# Patient Record
Sex: Female | Born: 2000
Health system: Southern US, Community
[De-identification: ages and names within clinical notes are randomized; demographics above are authoritative.]

## PROBLEM LIST (undated history)

## (undated) ENCOUNTER — Inpatient Hospital Stay (HOSPITAL_COMMUNITY): Payer: Self-pay

## (undated) DIAGNOSIS — D649 Anemia, unspecified: Secondary | ICD-10-CM

## (undated) HISTORY — DX: Anemia, unspecified: D64.9

---
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
## 2021-02-03 ENCOUNTER — Ambulatory Visit: Payer: Self-pay

## 2021-03-30 ENCOUNTER — Ambulatory Visit: Payer: Self-pay

## 2021-07-17 ENCOUNTER — Telehealth: Payer: Medicaid Other | Admitting: Nurse Practitioner

## 2021-07-17 DIAGNOSIS — B850 Pediculosis due to Pediculus humanus capitis: Secondary | ICD-10-CM

## 2021-07-17 DIAGNOSIS — J40 Bronchitis, not specified as acute or chronic: Secondary | ICD-10-CM | POA: Diagnosis not present

## 2021-07-17 MED ORDER — PREDNISONE 10 MG PO TABS: 10.0000 mg | ORAL_TABLET | Freq: Every day | ORAL | 0 refills | Status: DC

## 2021-07-17 MED ORDER — PERMETHRIN 5 % EX CREA: TOPICAL_CREAM | CUTANEOUS | 1 refills | Status: DC

## 2021-07-17 MED ORDER — PROMETHAZINE-DM 6.25-15 MG/5ML PO SYRP: 5.0000 mL | ORAL_SOLUTION | Freq: Four times a day (QID) | ORAL | 0 refills | Status: DC | PRN

## 2021-07-17 NOTE — Patient Instructions (Signed)
Deanna Welch, thank you for joining Claiborne Rigg, NP for today's virtual visit.  While this provider is not your primary care provider (PCP), if your PCP is located in our provider database this encounter information will be shared with them immediately following your visit.  Consent: (Patient) Deanna Welch provided verbal consent for this virtual visit at the beginning of the encounter.  Current Medications:  Current Outpatient Medications:    permethrin (ELIMITE) 5 % cream, Prior to application, wash hair with conditioner-free shampoo; rinse with water and towel dry. Apply a sufficient amount of cream rinse to saturate the hair and scalp. Leave on hair for 10 minutes (but no longer), then rinse off with warm water; remove remaining nits with nit comb. A single application is generally sufficient; however, may repeat 7 days after first treatment if lice or nits are still present., Disp: 60 g, Rfl: 1   predniSONE (DELTASONE) 10 MG tablet, Take 1 tablet (10 mg total) by mouth daily with breakfast., Disp: 5 tablet, Rfl: 0   promethazine-dextromethorphan (PROMETHAZINE-DM) 6.25-15 MG/5ML syrup, Take 5 mLs by mouth 4 (four) times daily as needed for cough., Disp: 240 mL, Rfl: 0   cetirizine-pseudoephedrine (ZYRTEC-D) 5-120 MG tablet, Take 1 tablet by mouth daily., Disp: 30 tablet, Rfl: 0   ketoconazole (NIZORAL) 2 % shampoo, Apply 1 application topically 2 (two) times a week., Disp: 120 mL, Rfl: 0   Medications ordered in this encounter:  Meds ordered this encounter  Medications   permethrin (ELIMITE) 5 % cream    Sig: Prior to application, wash hair with conditioner-free shampoo; rinse with water and towel dry. Apply a sufficient amount of cream rinse to saturate the hair and scalp. Leave on hair for 10 minutes (but no longer), then rinse off with warm water; remove remaining nits with nit comb. A single application is generally sufficient; however, may repeat 7 days after first treatment if  lice or nits are still present.    Dispense:  60 g    Refill:  1    Order Specific Question:   Supervising Provider    Answer:   Hyacinth Meeker, BRIAN [[id number]]   promethazine-dextromethorphan (PROMETHAZINE-DM) 6.25-15 MG/5ML syrup    Sig: Take 5 mLs by mouth 4 (four) times daily as needed for cough.    Dispense:  240 mL    Refill:  0    Order Specific Question:   Supervising Provider    Answer:   MILLER, BRIAN [[id number]]   predniSONE (DELTASONE) 10 MG tablet    Sig: Take 1 tablet (10 mg total) by mouth daily with breakfast.    Dispense:  5 tablet    Refill:  0    Order Specific Question:   Supervising Provider    Answer:   Hyacinth Meeker, BRIAN [[id number]]     *If you need refills on other medications prior to your next appointment, please contact your pharmacy*  Follow-Up: Call back or seek an in-person evaluation if the symptoms worsen or if the condition fails to improve as anticipated    If you have been instructed to have an in-person evaluation today at a local Urgent Care facility, please use the link below. It will take you to a list of all of our available [facility], including address, phone number and hours of operation. Please do not delay care.  [facility]  If you or a family member do not have a primary care provider, use the link below to schedule a visit  and establish care. When you choose a [facility] primary care physician or advanced practice provider, you gain a long-term partner in health. Find a Primary Care Provider  Learn more about [facility]'s in-office and virtual care options: [facility]

## 2021-07-17 NOTE — Progress Notes (Signed)
Virtual Visit Consent   Deanna Welch, you are scheduled for a virtual visit with a [facility] provider today.     Just as with appointments in the office, your consent must be obtained to participate.  Your consent will be active for this visit and any virtual visit you may have with one of our providers in the next 365 days.     If you have a MyChart account, a copy of this consent can be sent to you electronically.  All virtual visits are billed to your insurance company just like a traditional visit in the office.    As this is a virtual visit, video technology does not allow for your provider to perform a traditional examination.  This may limit your provider's ability to fully assess your condition.  If your provider identifies any concerns that need to be evaluated in person or the need to arrange testing (such as labs, EKG, etc.), we will make arrangements to do so.     Although advances in technology are sophisticated, we cannot ensure that it will always work on either your end or our end.  If the connection with a video visit is poor, the visit may have to be switched to a telephone visit.  With either a video or telephone visit, we are not always able to ensure that we have a secure connection.     I need to obtain your verbal consent now.   Are you willing to proceed with your visit today?    Deanna Welch has provided verbal consent on 07/17/2021 for a virtual visit (video or telephone).   Claiborne Rigg, NP   Date: 07/17/2021 9:16 AM   Virtual Visit via Video Note   I, Claiborne Rigg, connected with  Deanna Welch  ([id number], 03-25-2001) on 07/17/21 at  9:00 AM EST by a video-enabled telemedicine application and verified that I am speaking with the correct person using two identifiers.  Location: Patient: Virtual Visit Location Patient: Home Provider: Virtual Visit Location Provider: Home Office   I discussed the limitations of evaluation and management by  telemedicine and the availability of in person appointments. The patient expressed understanding and agreed to proceed.    History of Present Illness: Deanna Welch is a 20 y.o. who identifies as a female who was assigned female at birth, and is being seen today for head lice and cough.  HPI:  She endorses dry hacking cough for 2 weeks.  Cough is keeping her up at night as well.  OTC medications include Robitussin with no relief.  She denies any history of asthma, allergies, or smoking.  Associated symptoms of cough: Runny nose.  She also has lice in her har that she cant get rid of. Over the counter medications are not working.   Problems: There are no problems to display for this patient.   Allergies: No Known Allergies Medications:  Current Outpatient Medications:    permethrin (ELIMITE) 5 % cream, Prior to application, wash hair with conditioner-free shampoo; rinse with water and towel dry. Apply a sufficient amount of cream rinse to saturate the hair and scalp. Leave on hair for 10 minutes (but no longer), then rinse off with warm water; remove remaining nits with nit comb. A single application is generally sufficient; however, may repeat 7 days after first treatment if lice or nits are still present., Disp: 60 g, Rfl: 1   predniSONE (DELTASONE) 10 MG tablet, Take 1 tablet (10 mg total) by mouth daily  with breakfast., Disp: 5 tablet, Rfl: 0   promethazine-dextromethorphan (PROMETHAZINE-DM) 6.25-15 MG/5ML syrup, Take 5 mLs by mouth 4 (four) times daily as needed for cough., Disp: 240 mL, Rfl: 0   cetirizine-pseudoephedrine (ZYRTEC-D) 5-120 MG tablet, Take 1 tablet by mouth daily., Disp: 30 tablet, Rfl: 0   ketoconazole (NIZORAL) 2 % shampoo, Apply 1 application topically 2 (two) times a week., Disp: 120 mL, Rfl: 0  Observations/Objective: Patient is well-developed, well-nourished in no acute distress.  Resting comfortably  at home.  Head is normocephalic, atraumatic.  No labored  breathing.  Speech is clear and coherent with logical content.  Patient is alert and oriented at baseline.    Assessment and Plan: 1. Bronchitis - promethazine-dextromethorphan (PROMETHAZINE-DM) 6.25-15 MG/5ML syrup; Take 5 mLs by mouth 4 (four) times daily as needed for cough.  Dispense: 240 mL; Refill: 0 - predniSONE (DELTASONE) 10 MG tablet; Take 1 tablet (10 mg total) by mouth daily with breakfast.  Dispense: 5 tablet; Refill: 0  2. Pediculosis capitis - permethrin (ELIMITE) 5 % cream; Prior to application, wash hair with conditioner-free shampoo; rinse with water and towel dry. Apply a sufficient amount of cream rinse to saturate the hair and scalp. Leave on hair for 10 minutes (but no longer), then rinse off with warm water; remove remaining nits with nit comb. A single application is generally sufficient; however, may repeat 7 days after first treatment if lice or nits are still present.  Dispense: 60 g; Refill: 1   Follow Up Instructions: I discussed the assessment and treatment plan with the patient. The patient was provided an opportunity to ask questions and all were answered. The patient agreed with the plan and demonstrated an understanding of the instructions.  A copy of instructions were sent to the patient via MyChart unless otherwise noted below.     The patient was advised to call back or seek an in-person evaluation if the symptoms worsen or if the condition fails to improve as anticipated.  Time:  I spent 10 minutes with the patient via telehealth technology discussing the above problems/concerns.    Claiborne Rigg, NP

## 2024-01-19 ENCOUNTER — Ambulatory Visit
Admission: EM | Admit: 2024-01-19 | Discharge: 2024-01-19 | Disposition: A | Discharge status: Home / Self Care | Attending: Family Medicine | Admitting: Family Medicine

## 2024-01-19 DIAGNOSIS — R110 Nausea: Secondary | ICD-10-CM | POA: Diagnosis not present

## 2024-01-19 MED ORDER — ONDANSETRON 4 MG PO TBDP: 4.0000 mg | ORAL_TABLET | Freq: Three times a day (TID) | ORAL | 0 refills | Status: AC | PRN

## 2024-01-19 MED ORDER — ONDANSETRON 4 MG PO TBDP
4.0000 mg | ORAL_TABLET | Freq: Once | ORAL | Status: AC
  Administered 2024-01-19: 4 mg via ORAL

## 2024-01-19 NOTE — ED Provider Notes (Signed)
 [facility]    CSN: [id number] Arrival date & time: 01/19/24  0815      History   Chief Complaint No chief complaint on file.   HPI Deanna Welch is a 23 y.o. female.   Patient presenting today with 1 day history of nausea without vomiting.  Denies abdominal pain, fevers, chills, upper respiratory symptoms, diarrhea, rashes, new foods or medications, recent travel, known sick contacts.  So far not trying anything over-the-counter for symptoms.  Tolerating p.o. fluids this morning.  LMP 01/19/2024, currently on menstrual cycle.    History reviewed. No pertinent past medical history.  There are no active problems to display for this patient.   History reviewed. No pertinent surgical history.  OB History   No obstetric history on file.      Home Medications    Prior to Admission medications   Medication Sig Start Date End Date Taking? Authorizing Provider  ondansetron  (ZOFRAN -ODT) 4 MG disintegrating tablet Take 1 tablet (4 mg total) by mouth every 8 (eight) hours as needed for nausea or vomiting. 01/19/24  Yes Corbin Dess, PA-C  cetirizine -pseudoephedrine  (ZYRTEC -D) 5-120 MG tablet Take 1 tablet by mouth daily. 10/09/20   Wellington Half, FNP  ketoconazole  (NIZORAL ) 2 % shampoo Apply 1 application topically 2 (two) times a week. 10/12/20   Wellington Half, FNP  permethrin  (ELIMITE ) 5 % cream Prior to application, wash hair with conditioner-free shampoo; rinse with water and towel dry. Apply a sufficient amount of cream rinse to saturate the hair and scalp. Leave on hair for 10 minutes (but no longer), then rinse off with warm water; remove remaining nits with nit comb. A single application is generally sufficient; however, may repeat 7 days after first treatment if lice or nits are still present. 07/17/21   Fleming, Zelda W, NP  predniSONE  (DELTASONE ) 10 MG tablet Take 1 tablet (10 mg total) by mouth daily with breakfast. 07/17/21   Fleming,  Zelda W, NP  promethazine -dextromethorphan (PROMETHAZINE -DM) 6.25-15 MG/5ML syrup Take 5 mLs by mouth 4 (four) times daily as needed for cough. 07/17/21   Collins Dean, NP    Family History History reviewed. No pertinent family history.  Social History Social History   Tobacco Use   Smoking status: Passive Smoke Exposure - Never Smoker   Smokeless tobacco: Never  Vaping Use   Vaping status: Never Used  Substance Use Topics   Alcohol use: No   Drug use: No     Allergies   Patient has no known allergies.   Review of Systems Review of Systems Per HPI  Physical Exam Triage Vital Signs ED Triage Vitals  Encounter Vitals Group     BP 01/19/24 0853 119/78     Systolic BP Percentile --      Diastolic BP Percentile --      Pulse Rate 01/19/24 0853 (!) 129     Resp 01/19/24 0853 18     Temp 01/19/24 0853 98.4 F (36.9 C)     Temp Source 01/19/24 0853 Oral     SpO2 01/19/24 0853 96 %     Weight --      Height --      Head Circumference --      Peak Flow --      Pain Score 01/19/24 0855 0     Pain Loc --      Pain Education --      Exclude from Growth Chart --    No data  found.  Updated Vital Signs BP 119/78 (BP Location: Right Arm)   Pulse (!) 129   Temp 98.4 F (36.9 C) (Oral)   Resp 18   LMP 01/19/2024 (Exact Date)   SpO2 96%   Visual Acuity Right Eye Distance:   Left Eye Distance:   Bilateral Distance:    Right Eye Near:   Left Eye Near:    Bilateral Near:     Physical Exam Vitals and nursing note reviewed.  Constitutional:      Appearance: Normal appearance. She is not ill-appearing.  HENT:     Head: Atraumatic.  Eyes:     Extraocular Movements: Extraocular movements intact.     Conjunctiva/sclera: Conjunctivae normal.  Cardiovascular:     Rate and Rhythm: Normal rate and regular rhythm.     Heart sounds: Normal heart sounds.  Pulmonary:     Effort: Pulmonary effort is normal.     Breath sounds: Normal breath sounds.  Abdominal:      General: Bowel sounds are normal. There is no distension.     Tenderness: There is no abdominal tenderness. There is no guarding.  Musculoskeletal:        General: Normal range of motion.     Cervical back: Normal range of motion and neck supple.  Skin:    General: Skin is warm and dry.  Neurological:     Mental Status: She is alert and oriented to person, place, and time.  Psychiatric:        Mood and Affect: Mood normal.        Thought Content: Thought content normal.        Judgment: Judgment normal.      UC Treatments / Results  Labs (all labs ordered are listed, but only abnormal results are displayed) Labs Reviewed - No data to display  EKG   Radiology No results found.  Procedures Procedures (including critical care time)  Medications Ordered in UC Medications  ondansetron  (ZOFRAN -ODT) disintegrating tablet 4 mg (4 mg Oral Given 01/19/24 0915)    Initial Impression / Assessment and Plan / UC Course  I have reviewed the triage vital signs and the nursing notes.  Pertinent labs & imaging results that were available during my care of the patient were reviewed by me and considered in my medical decision making (see chart for details).     Zofran  given for active nausea, suspect either related to menstrual cycle or new viral GI illness.  Vitals and exam very reassuring with no red flag findings.  Mildly tachycardic in triage but otherwise vitals within normal limits.  Treat with Zofran , brat diet, fluids, work note given.  Return for worsening symptoms.  Final Clinical Impressions(s) / UC Diagnoses   Final diagnoses:  Nausea without vomiting   Discharge Instructions   None    ED Prescriptions     Medication Sig Dispense Auth. Provider   ondansetron  (ZOFRAN -ODT) 4 MG disintegrating tablet Take 1 tablet (4 mg total) by mouth every 8 (eight) hours as needed for nausea or vomiting. 20 tablet Corbin Dess, [place]      PDMP not reviewed this  encounter.   Tacy Expose [place], [place] 01/19/24 [phone]

## 2024-01-19 NOTE — ED Triage Notes (Signed)
 Pt reports nausea onset this morning, pt states she is currently on her period, coughing up mucus.

## 2024-03-12 ENCOUNTER — Other Ambulatory Visit: Payer: Self-pay

## 2024-03-12 ENCOUNTER — Ambulatory Visit
Admission: EM | Admit: 2024-03-12 | Discharge: 2024-03-12 | Disposition: A | Discharge status: Home / Self Care | Attending: Family Medicine | Admitting: Family Medicine

## 2024-03-12 ENCOUNTER — Encounter: Payer: Self-pay | Admitting: Emergency Medicine

## 2024-03-12 DIAGNOSIS — Z3201 Encounter for pregnancy test, result positive: Secondary | ICD-10-CM | POA: Diagnosis not present

## 2024-03-12 LAB — POCT URINE PREGNANCY: Preg Test, Ur: POSITIVE — AB

## 2024-03-12 NOTE — ED Provider Notes (Signed)
 [facility]    CSN: [id number] Arrival date & time: 03/12/24  1904      History   Chief Complaint Chief Complaint  Patient presents with   Possible Pregnancy    HPI Deanna Welch is a 23 y.o. female.   Patient presenting today requesting pregnancy confirmation.  Had a positive pregnancy test at home yesterday, called [facility] and they requested she come here for pregnancy confirmation.  She states her LMP was 02/02/2024.  Denies pelvic pain, vaginal bleeding or discharge, vomiting but has had some occasional mild nausea.    History reviewed. No pertinent past medical history.  There are no active problems to display for this patient.   History reviewed. No pertinent surgical history.  OB History   No obstetric history on file.      Home Medications    Prior to Admission medications   Medication Sig Start Date End Date Taking? Authorizing Provider  cetirizine -pseudoephedrine  (ZYRTEC -D) 5-120 MG tablet Take 1 tablet by mouth daily. 10/09/20   Alvia Corean CROME, FNP  ketoconazole  (NIZORAL ) 2 % shampoo Apply 1 application topically 2 (two) times a week. 10/12/20   Alvia Corean CROME, FNP  ondansetron  (ZOFRAN -ODT) 4 MG disintegrating tablet Take 1 tablet (4 mg total) by mouth every 8 (eight) hours as needed for nausea or vomiting. 01/19/24   Stuart Vernell Norris, PA-C  permethrin  (ELIMITE ) 5 % cream Prior to application, wash hair with conditioner-free shampoo; rinse with water and towel dry. Apply a sufficient amount of cream rinse to saturate the hair and scalp. Leave on hair for 10 minutes (but no longer), then rinse off with warm water; remove remaining nits with nit comb. A single application is generally sufficient; however, may repeat 7 days after first treatment if lice or nits are still present. 07/17/21   Fleming, Zelda W, NP  predniSONE  (DELTASONE ) 10 MG tablet Take 1 tablet (10 mg total) by mouth daily with breakfast. 07/17/21   Fleming,  Zelda W, NP  promethazine -dextromethorphan (PROMETHAZINE -DM) 6.25-15 MG/5ML syrup Take 5 mLs by mouth 4 (four) times daily as needed for cough. 07/17/21   Theotis Haze ORN, NP    Family History History reviewed. No pertinent family history.  Social History Social History   Tobacco Use   Smoking status: Passive Smoke Exposure - Never Smoker   Smokeless tobacco: Never  Vaping Use   Vaping status: Never Used  Substance Use Topics   Alcohol use: No   Drug use: No     Allergies   Patient has no known allergies.   Review of Systems Review of Systems Per HPI  Physical Exam Triage Vital Signs ED Triage Vitals  Encounter Vitals Group     BP 03/12/24 1908 118/84     Girls Systolic BP Percentile --      Girls Diastolic BP Percentile --      Boys Systolic BP Percentile --      Boys Diastolic BP Percentile --      Pulse Rate 03/12/24 1908 (!) 131     Resp 03/12/24 1908 18     Temp 03/12/24 1908 98.8 F (37.1 C)     Temp Source 03/12/24 1908 Oral     SpO2 03/12/24 1908 98 %     Weight --      Height --      Head Circumference --      Peak Flow --      Pain Score 03/12/24 1910 0  Pain Loc --      Pain Education --      Exclude from Growth Chart --    No data found.  Updated Vital Signs BP 118/84 (BP Location: Right Arm)   Pulse (!) 131   Temp 98.8 F (37.1 C) (Oral)   Resp 18   LMP 02/02/2024 (Approximate)   SpO2 98%   Visual Acuity Right Eye Distance:   Left Eye Distance:   Bilateral Distance:    Right Eye Near:   Left Eye Near:    Bilateral Near:     Physical Exam Vitals and nursing note reviewed.  Constitutional:      Appearance: Normal appearance. She is not ill-appearing.  HENT:     Head: Atraumatic.  Eyes:     Extraocular Movements: Extraocular movements intact.     Conjunctiva/sclera: Conjunctivae normal.  Cardiovascular:     Rate and Rhythm: Normal rate.  Pulmonary:     Effort: Pulmonary effort is normal.  Abdominal:     General:  Bowel sounds are normal. There is no distension.     Palpations: Abdomen is soft.     Tenderness: There is no abdominal tenderness. There is no guarding.  Musculoskeletal:        General: Normal range of motion.     Cervical back: Normal range of motion and neck supple.  Skin:    General: Skin is warm and dry.  Neurological:     Mental Status: She is alert and oriented to person, place, and time.  Psychiatric:        Mood and Affect: Mood normal.        Thought Content: Thought content normal.        Judgment: Judgment normal.      UC Treatments / Results  Labs (all labs ordered are listed, but only abnormal results are displayed) Labs Reviewed  POCT URINE PREGNANCY - Abnormal; Notable for the following components:      Result Value   Preg Test, Ur Positive (*)    All other components within normal limits    EKG   Radiology No results found.  Procedures Procedures (including critical care time)  Medications Ordered in UC Medications - No data to display  Initial Impression / Assessment and Plan / UC Course  I have reviewed the triage vital signs and the nursing notes.  Pertinent labs & imaging results that were available during my care of the patient were reviewed by me and considered in my medical decision making (see chart for details).     Vitals and exam reassuring today, she is tachycardic in triage likely secondary to anxiety.  Well-appearing and in no acute distress.  Pregnancy test positive here, recommend prenatal vitamin, healthy lifestyle and OB/GYN follow-up.  Final Clinical Impressions(s) / UC Diagnoses   Final diagnoses:  Positive pregnancy test     Discharge Instructions      Take your prenatal vitamin daily, [facility]    ED Prescriptions   None    PDMP not reviewed this encounter.   Stuart Vernell Norris, [place] 03/12/24 [phone]

## 2024-03-12 NOTE — ED Triage Notes (Signed)
 Pt reports would like to confirm pregnancy. Reports one home pregnancy test that was positive last night. LMP 6/13, hx of irregular periods.

## 2024-03-12 NOTE — Discharge Instructions (Signed)
 Take your prenatal vitamin daily, [facility] care

## 2024-04-08 ENCOUNTER — Other Ambulatory Visit: Payer: Self-pay | Admitting: Obstetrics & Gynecology

## 2024-04-08 DIAGNOSIS — O3680X0 Pregnancy with inconclusive fetal viability, not applicable or unspecified: Secondary | ICD-10-CM

## 2024-04-09 ENCOUNTER — Other Ambulatory Visit

## 2024-04-09 DIAGNOSIS — Z3491 Encounter for supervision of normal pregnancy, unspecified, first trimester: Secondary | ICD-10-CM | POA: Diagnosis not present

## 2024-04-09 DIAGNOSIS — Z3A08 8 weeks gestation of pregnancy: Secondary | ICD-10-CM | POA: Diagnosis not present

## 2024-04-09 DIAGNOSIS — O3680X0 Pregnancy with inconclusive fetal viability, not applicable or unspecified: Secondary | ICD-10-CM

## 2024-04-09 NOTE — Progress Notes (Signed)
 US  8+1 wks,single IUP with yolk sac,FHR 173 bpm,CRL 15.90 mm,normal right ovary,left ovary not visualized

## 2024-05-03 DIAGNOSIS — Z3400 Encounter for supervision of normal first pregnancy, unspecified trimester: Secondary | ICD-10-CM | POA: Insufficient documentation

## 2024-05-03 DIAGNOSIS — O0990 Supervision of high risk pregnancy, unspecified, unspecified trimester: Secondary | ICD-10-CM | POA: Insufficient documentation

## 2024-05-06 ENCOUNTER — Other Ambulatory Visit (HOSPITAL_COMMUNITY)
Admission: RE | Admit: 2024-05-06 | Discharge: 2024-05-06 | Disposition: A | Discharge status: Home / Self Care | Source: Ambulatory Visit | Attending: Advanced Practice Midwife | Admitting: Advanced Practice Midwife

## 2024-05-06 ENCOUNTER — Encounter: Payer: Self-pay | Admitting: Advanced Practice Midwife

## 2024-05-06 ENCOUNTER — Ambulatory Visit (INDEPENDENT_AMBULATORY_CARE_PROVIDER_SITE_OTHER): Admitting: Advanced Practice Midwife

## 2024-05-06 ENCOUNTER — Encounter: Admitting: *Deleted

## 2024-05-06 VITALS — BP 113/77 | HR 114 | Wt 121.0 lb

## 2024-05-06 DIAGNOSIS — Z124 Encounter for screening for malignant neoplasm of cervix: Secondary | ICD-10-CM | POA: Diagnosis not present

## 2024-05-06 DIAGNOSIS — Z3400 Encounter for supervision of normal first pregnancy, unspecified trimester: Secondary | ICD-10-CM | POA: Insufficient documentation

## 2024-05-06 DIAGNOSIS — Z31430 Encounter of female for testing for genetic disease carrier status for procreative management: Secondary | ICD-10-CM

## 2024-05-06 DIAGNOSIS — Z363 Encounter for antenatal screening for malformations: Secondary | ICD-10-CM | POA: Diagnosis not present

## 2024-05-06 DIAGNOSIS — Z3401 Encounter for supervision of normal first pregnancy, first trimester: Secondary | ICD-10-CM

## 2024-05-06 DIAGNOSIS — Z3A12 12 weeks gestation of pregnancy: Secondary | ICD-10-CM | POA: Insufficient documentation

## 2024-05-06 DIAGNOSIS — Z3481 Encounter for supervision of other normal pregnancy, first trimester: Secondary | ICD-10-CM

## 2024-05-06 MED ORDER — BLOOD PRESSURE CUFF MISC: 1.0000 | 0 refills | Status: AC

## 2024-05-06 MED ORDER — BLOOD PRESSURE CUFF MISC: 1.0000 | 0 refills | Status: DC

## 2024-05-06 MED ORDER — ASPIRIN 81 MG PO TBEC: 81.0000 mg | DELAYED_RELEASE_TABLET | Freq: Every day | ORAL | 6 refills | Status: AC

## 2024-05-06 NOTE — Progress Notes (Signed)
 INITIAL OBSTETRICAL VISIT Patient name: Deanna Welch MRN [id number]  Date of birth: Mar 27, 2001 Chief Complaint:   Initial Prenatal Visit  History of Present Illness:   Deanna Welch is a 23 y.o. G1P0 Caucasian female at [redacted]w[redacted]d by sue LMP with an Estimated Date of Delivery: 11/18/24 being seen today for her initial obstetrical visit.   Patient's last menstrual period was 02/12/2024 (exact date). Her obstetrical history is significant for primagravida       05/06/2024   11:47 AM 09/09/2019    1:41 PM  Depression screen PHQ 2/9  Decreased Interest 3 2  Down, Depressed, Hopeless 1 2  PHQ - 2 Score 4 4  Altered sleeping 3 2  Tired, decreased energy 3 0  Change in appetite 3 0  Feeling bad or failure about yourself  0 2  Trouble concentrating 0 2  Moving slowly or fidgety/restless 0 0  Suicidal thoughts 0   PHQ-9 Score 13 10        05/06/2024   11:47 AM  GAD 7 : Generalized Anxiety Score  Nervous, Anxious, on Edge 1  Control/stop worrying 1  Worry too much - different things 1  Trouble relaxing 2  Restless 1  Easily annoyed or irritable 3  Afraid - awful might happen 1  Total GAD 7 Score 10    Mental Health score elevated Follow up  [place] info given  Review of Systems:   Pertinent items are noted in HPI Denies cramping/contractions, leakage of fluid, vaginal bleeding, abnormal vaginal discharge w/ itching/odor/irritation, headaches, visual changes, shortness of breath, chest pain, abdominal pain, severe nausea/vomiting, or problems with urination or bowel movements unless otherwise stated above.  Pertinent History Reviewed:  Reviewed past medical,surgical, social, obstetrical and family history.  Reviewed problem list, medications and allergies. OB History  Gravida Para Term Preterm AB Living  1       SAB IAB Ectopic Multiple Live Births          # Outcome Date GA Lbr Len/2nd Weight Sex Type Anes PTL Lv  1 Current            Physical Assessment:   Vitals:    05/06/24 1139  BP: 113/77  Pulse: (!) 114  Weight: 121 lb (54.9 kg)  There is no height or weight on file to calculate BMI.       Physical Examination:  General appearance - well appearing, and in no distress  Mental status - alert, oriented to person, place, and time  Psych:  She has a normal mood and affect  Skin - warm and dry, normal color, no suspicious lesions noted  Chest - effort normal, all lung fields clear to auscultation bilaterally  Heart - normal rate and regular rhythm  Abdomen - soft, nontender  Extremities:  No swelling or varicosities noted  Pelvic - VULVA: normal appearing vulva with no masses, tenderness or lesions  VAGINA: normal appearing vagina with normal color and discharge, no lesions  CERVIX: normal appearing cervix without discharge or lesions, no CMT  Thin prep pap is done without HR HPV cotesting  Chaperone: Peggy Dones  TODAY'S FHR 160 US   No results found for this or any previous visit (from the past 24 hours).  Assessment & Plan:  1) low-Risk Pregnancy G1P0 at [redacted]w[redacted]d with an Estimated Date of Delivery: 11/18/24   2) Initial OB visit  3)   Meds:  Meds ordered this encounter  Medications   DISCONTD: Blood Pressure Monitoring (BLOOD PRESSURE CUFF)  MISC    Sig: 1 kit by Does not apply route as directed.    Dispense:  1 each    Refill:  0   aspirin  EC 81 MG tablet    Sig: Take 1 tablet (81 mg total) by mouth daily.    Dispense:  30 tablet    Refill:  6    Supervising Provider:   JAYNE MINDER H [[id number]]   Blood Pressure Monitoring (BLOOD PRESSURE CUFF) MISC    Sig: 1 kit by Does not apply route as directed.    Dispense:  1 each    Refill:  0    Initial labs obtained Continue prenatal vitamins Reviewed n/v relief measures and warning s/s to report Reviewed recommended weight gain based on pre-gravid BMI Encouraged well-balanced diet Genetic & carrier screening discussed: declines Panorama, NT/IT, AFP, and Horizon ,  Ultrasound discussed;  fetal survey: requested CCNC completed> form faxed if has or is planning to apply for medicaid The nature of [employer] for [employer] with multiple MDs and other Advanced Practice Providers was explained to patient; also emphasized that fellows, residents, and students are part of our team. Does not have home bp cuff. Office bp cuff given: yes. Rx sent: n/a. Check bp weekly, let us  know if consistently >140/90.   Indications for ASA therapy (per uptodate)   OR Two or more of the following: Nulliparity Yes Obesity (BMI>30 kg/m2) No Family h/o preeclampsia in mother or sister Yes  Follow-up: Return in about 4 weeks (around 06/03/2024) for LROB and 7 weeks anatomy scan/LROB.   Orders Placed This Encounter  Procedures   Urine Culture   US  OB Comp + 14 Wk   CBC/D/Plt+RPR+Rh+ABO+RubIgG...    Cathlean Ely CNM, [facility] 05/06/2024 4:21 PM

## 2024-05-06 NOTE — Patient Instructions (Addendum)
 [facility] offers online women's holistic mental health counseling and therapy provided by licensed mental health counselors and therapists.   You can refer yourself using the link below: (if it isn't clickable from your mychart account, copy and paste it in a new browser).  If you have ANY problems, please let me know and I will help troubleshoot.   [email or web address]  OR  [email or web address]      Gaylen Ford, I greatly value your feedback.  If you receive a survey following your visit with us  today, we appreciate you taking the time to fill it out.  Thanks, Sherrell Ely, DNP, CNM  [facility] HAS MOVED!!! It is now [facility] at [facility] ([address] [place], [place] [address]) Entrance located off of [address] Free 24/7 valet parking   Nausea & Vomiting Have saltine crackers or pretzels by your bed and eat a few bites before you raise your head out of bed in the morning Eat small frequent meals throughout the day instead of large meals Drink plenty of fluids throughout the day to stay hydrated, just don't drink a lot of fluids with your meals.  This can make your stomach fill up faster making you feel sick Do not brush your teeth right after you eat Products with real ginger are good for nausea, like ginger ale and ginger hard candy Make sure it says made with real ginger! Sucking on sour candy like lemon heads is also good for nausea If your prenatal vitamins make you nauseated, take them at night so you will sleep through the nausea Sea Bands If you feel like you need medicine for the nausea & vomiting please let us  know If you are unable to keep any fluids or food down please let us  know   Constipation Drink plenty of fluid, preferably water, throughout the day Eat foods high in fiber such as fruits, vegetables, and grains Exercise, such as walking, is a good  way to keep your bowels regular Drink warm fluids, especially warm prune juice, or decaf coffee Eat a 1/2 cup of real oatmeal (not instant), 1/2 cup applesauce, and 1/2-1 cup warm prune juice every day If needed, you may take Colace (docusate sodium) stool softener once or twice a day to help keep the stool soft.  If you still are having problems with constipation, you may take Miralax once daily as needed to help keep your bowels regular.   Home Blood Pressure Monitoring for Patients   Your provider has recommended that you check your blood pressure (BP) at least once a week at home. If you do not have a blood pressure cuff at home, one will be provided for you. Contact your provider if you have not received your monitor within 1 week.   Helpful Tips for Accurate Home Blood Pressure Checks  Don't smoke, exercise, or drink caffeine 30 minutes before checking your BP Use the restroom before checking your BP (a full bladder can raise your pressure) Relax in a comfortable upright chair Feet on the ground Left arm resting comfortably on a flat surface at the level of your heart Legs uncrossed Back supported Sit quietly and don't talk Place the cuff on your bare arm Adjust snuggly, so that only two fingertips can fit between your skin and the top of the cuff Check 2 readings separated by at least one minute Keep a log of your BP readings For a visual, please reference this diagram: http://ccnc.care/bpdiagram  Provider Name: [facility]  OB/GYN     Phone: [phone]  Zone 1: ALL CLEAR  Continue to monitor your symptoms:  BP reading is less than 140 (top number) or less than 90 (bottom number)  No right upper stomach pain No headaches or seeing spots No feeling nauseated or throwing up No swelling in face and hands  Zone 2: CAUTION Call your doctor's office for any of the following:  BP reading is greater than 140 (top number) or greater than 90 (bottom number)  Stomach pain under  your ribs in the middle or right side Headaches or seeing spots Feeling nauseated or throwing up Swelling in face and hands  Zone 3: EMERGENCY  Seek immediate medical care if you have any of the following:  BP reading is greater than160 (top number) or greater than 110 (bottom number) Severe headaches not improving with Tylenol  Serious difficulty catching your breath Any worsening symptoms from Zone 2    First Trimester of Pregnancy The first trimester of pregnancy is from week 1 until the end of week 12 (months 1 through 3). A week after a sperm fertilizes an egg, the egg will implant on the wall of the uterus. This embryo will begin to develop into a baby. Genes from you and your partner are forming the baby. The female genes determine whether the baby is a boy or a girl. At 6-8 weeks, the eyes and face are formed, and the heartbeat can be seen on ultrasound. At the end of 12 weeks, all the baby's organs are formed.  Now that you are pregnant, you will want to do everything you can to have a healthy baby. Two of the most important things are to get good prenatal care and to follow your health care provider's instructions. Prenatal care is all the medical care you receive before the baby's birth. This care will help prevent, find, and treat any problems during the pregnancy and childbirth. BODY CHANGES Your body goes through many changes during pregnancy. The changes vary from woman to woman.  You may gain or lose a couple of pounds at first. You may feel sick to your stomach (nauseous) and throw up (vomit). If the vomiting is uncontrollable, call your health care provider. You may tire easily. You may develop headaches that can be relieved by medicines approved by your health care provider. You may urinate more often. Painful urination may mean you have a bladder infection. You may develop heartburn as a result of your pregnancy. You may develop constipation because certain hormones are  causing the muscles that push waste through your intestines to slow down. You may develop hemorrhoids or swollen, bulging veins (varicose veins). Your breasts may begin to grow larger and become tender. Your nipples may stick out more, and the tissue that surrounds them (areola) may become darker. Your gums may bleed and may be sensitive to brushing and flossing. Dark spots or blotches (chloasma, mask of pregnancy) may develop on your face. This will likely fade after the baby is born. Your menstrual periods will stop. You may have a loss of appetite. You may develop cravings for certain kinds of food. You may have changes in your emotions from day to day, such as being excited to be pregnant or being concerned that something may go wrong with the pregnancy and baby. You may have more vivid and strange dreams. You may have changes in your hair. These can include thickening of your hair, rapid growth, and changes in texture. Some women also have  hair loss during or after pregnancy, or hair that feels dry or thin. Your hair will most likely return to normal after your baby is born. WHAT TO EXPECT AT YOUR PRENATAL VISITS During a routine prenatal visit: You will be weighed to make sure you and the baby are growing normally. Your blood pressure will be taken. Your abdomen will be measured to track your baby's growth. The fetal heartbeat will be listened to starting around week 10 or 12 of your pregnancy. Test results from any previous visits will be discussed. Your health care provider may ask you: How you are feeling. If you are feeling the baby move. If you have had any abnormal symptoms, such as leaking fluid, bleeding, severe headaches, or abdominal cramping. If you have any questions. Other tests that may be performed during your first trimester include: Blood tests to find your blood type and to check for the presence of any previous infections. They will also be used to check for low iron  levels (anemia) and Rh antibodies. Later in the pregnancy, blood tests for diabetes will be done along with other tests if problems develop. Urine tests to check for infections, diabetes, or protein in the urine. An ultrasound to confirm the proper growth and development of the baby. An amniocentesis to check for possible genetic problems. Fetal screens for spina bifida and Down syndrome. You may need other tests to make sure you and the baby are doing well. HOME CARE INSTRUCTIONS  Medicines Follow your health care provider's instructions regarding medicine use. Specific medicines may be either safe or unsafe to take during pregnancy. Take your prenatal vitamins as directed. If you develop constipation, try taking a stool softener if your health care provider approves. Diet Eat regular, well-balanced meals. Choose a variety of foods, such as meat or vegetable-based protein, fish, milk and low-fat dairy products, vegetables, fruits, and whole grain breads and cereals. Your health care provider will help you determine the amount of weight gain that is right for you. Avoid raw meat and uncooked cheese. These carry germs that can cause birth defects in the baby. Eating four or five small meals rather than three large meals a day may help relieve nausea and vomiting. If you start to feel nauseous, eating a few soda crackers can be helpful. Drinking liquids between meals instead of during meals also seems to help nausea and vomiting. If you develop constipation, eat more high-fiber foods, such as fresh vegetables or fruit and whole grains. Drink enough fluids to keep your urine clear or pale yellow. Activity and Exercise Exercise only as directed by your health care provider. Exercising will help you: Control your weight. Stay in shape. Be prepared for labor and delivery. Experiencing pain or cramping in the lower abdomen or low back is a good sign that you should stop exercising. Check with your  health care provider before continuing normal exercises. Try to avoid standing for long periods of time. Move your legs often if you must stand in one place for a long time. Avoid heavy lifting. Wear low-heeled shoes, and practice good posture. You may continue to have sex unless your health care provider directs you otherwise. Relief of Pain or Discomfort Wear a good support bra for breast tenderness.   Take warm sitz baths to soothe any pain or discomfort caused by hemorrhoids. Use hemorrhoid cream if your health care provider approves.   Rest with your legs elevated if you have leg cramps or low back pain. If you  develop varicose veins in your legs, wear support hose. Elevate your feet for 15 minutes, 3-4 times a day. Limit salt in your diet. Prenatal Care Schedule your prenatal visits by the twelfth week of pregnancy. They are usually scheduled monthly at first, then more often in the last 2 months before delivery. Write down your questions. Take them to your prenatal visits. Keep all your prenatal visits as directed by your health care provider. Safety Wear your seat belt at all times when driving. Make a list of emergency phone numbers, including numbers for family, friends, the hospital, and police and fire departments. General Tips Ask your health care provider for a referral to a local prenatal education class. Begin classes no later than at the beginning of month 6 of your pregnancy. Ask for help if you have counseling or nutritional needs during pregnancy. Your health care provider can offer advice or refer you to specialists for help with various needs. Do not use hot tubs, steam rooms, or saunas. Do not douche or use tampons or scented sanitary pads. Do not cross your legs for long periods of time. Avoid cat litter boxes and soil used by cats. These carry germs that can cause birth defects in the baby and possibly loss of the fetus by miscarriage or stillbirth. Avoid all smoking,  herbs, alcohol, and medicines not prescribed by your health care provider. Chemicals in these affect the formation and growth of the baby. Schedule a dentist appointment. At home, brush your teeth with a soft toothbrush and be gentle when you floss. SEEK MEDICAL CARE IF:  You have dizziness. You have mild pelvic cramps, pelvic pressure, or nagging pain in the abdominal area. You have persistent nausea, vomiting, or diarrhea. You have a bad smelling vaginal discharge. You have pain with urination. You notice increased swelling in your face, hands, legs, or ankles. SEEK IMMEDIATE MEDICAL CARE IF:  You have a fever. You are leaking fluid from your vagina. You have spotting or bleeding from your vagina. You have severe abdominal cramping or pain. You have rapid weight gain or loss. You vomit blood or material that looks like coffee grounds. You are exposed to [place] measles and have never had them. You are exposed to fifth disease or chickenpox. You develop a severe headache. You have shortness of breath. You have any kind of trauma, such as from a fall or a car accident. Document Released: 08/02/2001 Document Revised: 12/23/2013 Document Reviewed: 06/18/2013 [facility] Patient Information 2015 [place], [place]. This information is not intended to replace advice given to you by your health care provider. Make sure you discuss any questions you have with your health care provider.  ADDITIONAL HEALTHCARE OPTIONS FOR PATIENTS  [facility] / e-Visit: [email or web address]         [facility]: [phone]  [facility]: [phone]                   [facility]: [phone]     Safe Medications in Pregnancy   Acne: Benzoyl Peroxide Salicylic Acid  Backache/Headache: Tylenol : 2 regular strength every 4 hours OR              2 Extra strength every 6 hours  Colds/Coughs/Allergies: Benadryl  (alcohol free) 25 mg every 6 hours as needed Breath right strips Claritin Cepacol throat lozenges Chloraseptic throat spray Cold-Eeze- up to three times per day Cough drops, alcohol free Flonase (by prescription only) Guaifenesin Mucinex Robitussin DM (plain only, alcohol  free) Saline nasal spray/drops Sudafed (pseudoephedrine ) & Actifed ** use only after [redacted] weeks gestation and if you do not have high blood pressure Tylenol  Vicks Vaporub Zinc lozenges Zyrtec    Constipation: Colace Ducolax suppositories Fleet enema Glycerin suppositories Metamucil Milk of magnesia Miralax Senokot Smooth move tea  Diarrhea: Kaopectate Imodium A-D  *NO pepto Bismol  Hemorrhoids: Anusol Anusol HC Preparation H Tucks  Indigestion: Tums Maalox Mylanta Zantac  Pepcid  Insomnia: Benadryl (alcohol free) 25mg  every 6 hours as needed Tylenol  PM Unisom, no Gelcaps  Leg Cramps: Tums MagGel  Nausea/Vomiting:  Bonine Dramamine Emetrol Ginger extract Sea bands Meclizine  Nausea medication to take during pregnancy:  Unisom (doxylamine succinate 25 mg tablets) Take one tablet daily at bedtime. If symptoms are not adequately controlled, the dose can be increased to a maximum recommended dose of two tablets daily (1/2 tablet in the morning, 1/2 tablet mid-afternoon and one at bedtime). Vitamin B6 100mg  tablets. Take one tablet twice a day (up to 200 mg per day).  Skin Rashes: Aveeno products Benadryl cream or 25mg  every 6 hours as needed Calamine Lotion 1% cortisone cream  Yeast infection: Gyne-lotrimin 7 Monistat 7   **If taking multiple medications, please check labels to avoid duplicating the same active ingredients **take medication as directed on the label ** Do not exceed 4000 mg of tylenol  in 24 hours **Do not take medications that contain aspirin  or ibuprofen    ([phone] is the phone number for Pregnancy Classes or hospital tours at [facility].    You will be referred to  [email or web address]   for more information on childbirth classes   At this site you may register for classes. You may sign up for a waiting list if classes are full. Please SIGN UP FOR THIS!.   When the waiting list becomes long, sometimes new classes can be added.  [facility] at [facility] Call to Register: [phone] or [phone]   or   Register Online: [email or web address] THESE CLASSES FILL UP VERY QUICKLY, SO SIGN UP AS SOON AS YOU CAN!!! Please visit [facility] pregnancy website at [email or web address]  Childbirth Classes  Option 1: Birth & Baby Series Series of 3 weekly classes, on the same day of the week (can choose Mon-Thurs) from 6-9pm Helps you and your support person prepare for childbirth Reviews newborn care, labor & birth, cesarean birth, pain management, and comfort techniques Cost: $60 per couple for insured or self-pay, $30 per couple for Medicaid  Option 2: Weekend Birth & Baby This class is a weekend version of our Birth & Baby series.  It is designed for parents who have a difficult time fitting several weeks of classes into their schedule.   Covers the care of your newborn and the basics of labor and childbirth Friday 6:30pm-8:30pm Saturday 9am-4pm, includes lunch for you and your partner  Cost: $75 per couple for insured or self-pay, $30 per couple for Medicaid  Option 3: Natural Childbirth This series of 5 weekly classes is for expectant parents who want to learn and practice natural methods of coping with the process of labor and childbirth.  Can choose Mon or Tues, 7-9pm.   Covers relaxation, breathing, massage, visualization, role of the partner, and helpful positioning Participants learn how to be confident in their body's ability to give birth. Class empowers and helps parents make informed decisions about care. Includes  discussion that will help new parents transition into the immediate postpartum period.  Cost: $75 per couple for insured or  self-pay, $30 per couple for Medicaid  Option 4: Online Birth & Baby This online class offers you the freedom to complete a Birth & Baby series in the comfort of your own home.  The flexibility of this option allows you to review sections at your own pace, at times convenient to you and your support people.  It includes additional video information, animations, quizzes and extended activities. Get organized with helpful eClass tools, checklists, and trackers.  Cost: $60 for 60 days of online access                                                                            Other Available Classes  Baby & Me Enjoy this time to discuss newborn & infant parenting topics and family adjustment issues with other new mothers in a relaxed environment. Each week brings a new speaker or baby-centered activity. We encourage mothers and their babies (birth to crawling) to join us . You are welcome to visit this group even if you haven't delivered yet! It's wonderful to make new friends early and watch other moms interact with their babies. No registration or fee.  Big Brother/Big Sister Let your children share in the joy of a new brother or sister in this special class designed just for them. Discussion includes how families care for babies: swaddling, holding, diapering, safety, as well as how they can be helpful in their new role. This class is designed for children ages 2 to 19, but any age is welcome. Please register each child individually. $5 Breastfeeding Support Group This group is a mother-to-mother support circle where moms have the opportunity to share their breastfeeding experiences. A Breastfeeding Support nurse is present for questions and concerns. An infant scale is available for weight checks. No fee or registration.  Breastfeeding Your Baby Breastfeeding is a special time  for mother and child. This class will help you feel ready to begin this important relationship. Your partner is encouraged to attend with you. Learn what to expect and feel more confident in the first days of breastfeeding your newborn. This class also addresses the most common fears and challenges of breastfeeding during the first few weeks, months, and beyond. $30 per couple Caring for Baby This class is for expectant and adoptive parents who want to learn and practice the most up-to-date newborn care for their babies. Focus is on birth through first six weeks of life. Topics include feeding, bathing, diapering, crying, umbilical cord care, circumcision care and safe sleep. Parents learn how to recognize symptoms of illness and when to call the pediatrician. Register only the mom-to-be and your partner can plan to come with you. (*Note: This class is included in the Birth & Baby series and the Weekend Birth & Baby classes.) $10 per couple Comfort Techniques & Tour This 2-hour interactive class is designed for those who either do not wish to take the Birth & Baby series or for those who prefer our online childbirth class, but don't want to miss the opportunity to learn and practice hands-on techniques. These skills can help relieve some of the discomfort of labor and encourage your baby to rotate toward the best position for birth. You and your partner will be  able to try a variety of labor positions with birth balls and rebozos as well as practice breathing, relaxation, and visual techniques. $20 per couple [employer] [facility] This course offers Dads-to-be the tools and knowledge needed to feel confident on their journey to becoming new fathers. Experienced dads, who have been trained as coaches, teach dads-to-be how to hold, comfort, diapers, swaddle and play with their infant while being able to support the new mom as well. $25 Grandparent Love Expecting a grandbaby? Learn about the latest infant care and  safety recommendations and ways to support your own child as he or she transitions into the parenting role. $10 per person Infant and Child CPR Parents, grandparents, babysitters, and friends learn Cardio-Pulmonary Resuscitation skills for infants and children. You will also learn how to treat both conscious and unconscious choking infants and children. Register each participant individually. (Note: This Family & Friends program does not offer certification.) $20 per person Marvelous Multiples Expecting twins, triplets, or more? This free 2-hour class covers the differences in labor, birth, parenting, and breastfeeding issues that face multiples' parents.  Maternity Care Center Virtual Tour  Online virtual tour of the new [facility] Talk This free mom-led group offers support and connection to mothers as they journey through the adjustments and struggles of that sometimes overwhelming first year after the birth of a child. A member of our staff will be present to share resources and additional support if needed, as you care for yourself and baby. You are welcome to visit this group before you deliver! It's wonderful to meet new friends early and watch other moms interact with their babies.  Waterbirth Class Interested in a waterbirth? This free informational class will help you discover whether waterbirth is the right fit for you and is required if you are planning a waterbirth. Education about waterbirth itself, supplies you may need, and what you may need from your support team is included in this class. Partners are encouraged to come.

## 2024-05-07 LAB — AB SCR+ANTIBODY ID

## 2024-05-07 LAB — CYTOLOGY - PAP
Chlamydia: NEGATIVE
Comment: NEGATIVE
Comment: NORMAL
Diagnosis: NEGATIVE
Neisseria Gonorrhea: NEGATIVE

## 2024-05-08 LAB — URINE CULTURE

## 2024-05-09 ENCOUNTER — Encounter: Payer: Self-pay | Admitting: Advanced Practice Midwife

## 2024-05-09 DIAGNOSIS — Z6791 Unspecified blood type, Rh negative: Secondary | ICD-10-CM | POA: Insufficient documentation

## 2024-05-10 ENCOUNTER — Encounter: Payer: Self-pay | Admitting: Advanced Practice Midwife

## 2024-05-10 DIAGNOSIS — O360990 Maternal care for other rhesus isoimmunization, unspecified trimester, not applicable or unspecified: Secondary | ICD-10-CM | POA: Insufficient documentation

## 2024-05-10 DIAGNOSIS — O360910 Maternal care for other rhesus isoimmunization, first trimester, not applicable or unspecified: Secondary | ICD-10-CM | POA: Insufficient documentation

## 2024-05-10 LAB — CBC/D/PLT+RPR+RH+ABO+RUBIGG...
Basophils Absolute: 0 x10E3/uL (ref 0.0–0.2)
Basos: 0 %
EOS (ABSOLUTE): 0 x10E3/uL (ref 0.0–0.4)
Eos: 1 %
HCV Ab: NONREACTIVE
HIV Screen 4th Generation wRfx: NONREACTIVE
Hematocrit: 40.5 % (ref 34.0–46.6)
Hemoglobin: 13.3 g/dL (ref 11.1–15.9)
Hepatitis B Surface Ag: NEGATIVE
Immature Grans (Abs): 0 x10E3/uL (ref 0.0–0.1)
Immature Granulocytes: 0 %
Lymphocytes Absolute: 0.9 x10E3/uL (ref 0.7–3.1)
Lymphs: 14 %
MCH: 30 pg (ref 26.6–33.0)
MCHC: 32.8 g/dL (ref 31.5–35.7)
MCV: 91 fL (ref 79–97)
Monocytes Absolute: 0.4 x10E3/uL (ref 0.1–0.9)
Monocytes: 6 %
Neutrophils Absolute: 5.2 x10E3/uL (ref 1.4–7.0)
Neutrophils: 79 %
Platelets: 177 x10E3/uL (ref 150–450)
RBC: 4.44 x10E6/uL (ref 3.77–5.28)
RDW: 11.8 % (ref 11.7–15.4)
RPR Ser Ql: NONREACTIVE
Rh Factor: NEGATIVE
Rubella Antibodies, IGG: 1.29 {index} (ref >0.99)
WBC: 6.7 x10E3/uL (ref 3.4–10.8)

## 2024-05-10 LAB — AB SCR+ANTIBODY ID
Antibody Screen: POSITIVE — AB
Coombs Titer #1: 4

## 2024-05-10 LAB — HCV INTERPRETATION

## 2024-05-16 ENCOUNTER — Ambulatory Visit: Payer: Self-pay | Admitting: Advanced Practice Midwife

## 2024-06-03 ENCOUNTER — Other Ambulatory Visit (HOSPITAL_COMMUNITY)
Admission: RE | Admit: 2024-06-03 | Discharge: 2024-06-03 | Disposition: A | Discharge status: Home / Self Care | Source: Ambulatory Visit | Attending: Women's Health | Admitting: Women's Health

## 2024-06-03 ENCOUNTER — Encounter: Payer: Self-pay | Admitting: Women's Health

## 2024-06-03 ENCOUNTER — Ambulatory Visit: Admitting: Women's Health

## 2024-06-03 VITALS — BP 118/71 | HR 121 | Wt 121.0 lb

## 2024-06-03 DIAGNOSIS — O26852 Spotting complicating pregnancy, second trimester: Secondary | ICD-10-CM | POA: Insufficient documentation

## 2024-06-03 DIAGNOSIS — Z3402 Encounter for supervision of normal first pregnancy, second trimester: Secondary | ICD-10-CM | POA: Insufficient documentation

## 2024-06-03 DIAGNOSIS — Z3A16 16 weeks gestation of pregnancy: Secondary | ICD-10-CM

## 2024-06-03 NOTE — Patient Instructions (Signed)
 Gaylen, thank you for choosing our office today! We appreciate the opportunity to meet your healthcare needs. You may receive a short survey by mail, e-mail, or through [employer]. If you are happy with your care we would appreciate if you could take just a few minutes to complete the survey questions. We read all of your comments and take your feedback very seriously. Thank you again for choosing our office.  Center for [employer] Team at [facility] at [facility] ([address] [place], [place] [address]) Entrance C, located off of [address] Free 24/7 valet parking  Go to [email or web address] to register for FREE online childbirth classes  Call the office [phone]) or go to [facility] if: You begin to severe cramping Your water breaks.  Sometimes it is a big gush of fluid, sometimes it is just a trickle that keeps getting your panties wet or running down your legs You have vaginal bleeding.  It is normal to have a small amount of spotting if your cervix was checked.   [facility] Pediatricians/Family Doctors [facility]): [address] [place], [phone]           [facility]: [address], [phone]                [facility]): [address], [phone] (call to ask if accepting patients) [facility] Department: [address], [place], [phone]    [facility] Pediatricians/Family Doctors [facility]): [phone] [address], [phone] [facility]: [address] [place], [phone] [facility]: [address], [phone]  [facility] Doctors  [facility]): [phone] [facility]: [address], [phone]   [facility] Doctors [facility]: [address], [phone]  [facility] Doctors  [facility]: [phone], [phone]  Home Blood Pressure Monitoring for Patients   Your provider has recommended that you check your blood pressure (BP) at least once a week at home. If you do not have a blood pressure cuff at home, one will be provided for you. Contact your provider if you have not received your monitor within 1 week.   Helpful Tips for Accurate Home Blood Pressure Checks  Don't smoke, exercise, or drink caffeine 30 minutes before checking your BP Use the restroom before checking your BP (a full bladder can raise your pressure) Relax in a comfortable upright chair Feet on the ground Left arm resting comfortably on a flat surface at the level of your heart Legs uncrossed Back supported Sit quietly and don't talk Place the cuff on your bare arm Adjust snuggly, so that only two fingertips can fit between your skin and the top of the cuff Check 2 readings separated by at least one minute Keep a log of your BP readings For a visual, please reference this diagram: http://ccnc.care/bpdiagram  Provider Name: [facility]     Phone: [phone]  Zone 1: ALL CLEAR  Continue to monitor your symptoms:  BP reading is less than 140 (top number) or less than 90 (bottom number)  No right upper stomach pain No headaches or seeing spots No feeling nauseated or throwing up No swelling in face and hands  Zone 2: CAUTION Call your doctor's office for any of the following:  BP reading is greater than 140 (top number) or greater than  90 (bottom number)  Stomach pain under your ribs in the middle or right side Headaches or seeing spots Feeling nauseated or throwing up Swelling in face and hands  Zone 3: EMERGENCY  Seek immediate medical care if you have any of the following:  BP reading is greater than160 (top number) or greater than 110 (bottom number) Severe headaches not improving with Tylenol  Serious difficulty catching your breath Any worsening symptoms from  Zone 2     Second Trimester of Pregnancy The second trimester is from week 14 through week 27 (months 4 through 6). The second trimester is often a time when you feel your best. Your body has adjusted to being pregnant, and you begin to feel better physically. Usually, morning sickness has lessened or quit completely, you may have more energy, and you may have an increase in appetite. The second trimester is also a time when the fetus is growing rapidly. At the end of the sixth month, the fetus is about 9 inches long and weighs about 1 pounds. You will likely begin to feel the baby move (quickening) between 16 and 20 weeks of pregnancy. Body changes during your second trimester Your body continues to go through many changes during your second trimester. The changes vary from woman to woman. Your weight will continue to increase. You will notice your lower abdomen bulging out. You may begin to get stretch marks on your hips, abdomen, and breasts. You may develop headaches that can be relieved by medicines. The medicines should be approved by your health care provider. You may urinate more often because the fetus is pressing on your bladder. You may develop or continue to have heartburn as a result of your pregnancy. You may develop constipation because certain hormones are causing the muscles that push waste through your intestines to slow down. You may develop hemorrhoids or swollen, bulging veins (varicose veins). You may have back pain. This is caused by: Weight gain. Pregnancy hormones that are relaxing the joints in your pelvis. A shift in weight and the muscles that support your balance. Your breasts will continue to grow and they will continue to become tender. Your gums may bleed and may be sensitive to brushing and flossing. Dark spots or blotches (chloasma, mask of pregnancy) may develop on your face. This will likely fade after the baby is born. A dark line from your belly button to  the pubic area (linea nigra) may appear. This will likely fade after the baby is born. You may have changes in your hair. These can include thickening of your hair, rapid growth, and changes in texture. Some women also have hair loss during or after pregnancy, or hair that feels dry or thin. Your hair will most likely return to normal after your baby is born.  What to expect at prenatal visits During a routine prenatal visit: You will be weighed to make sure you and the fetus are growing normally. Your blood pressure will be taken. Your abdomen will be measured to track your baby's growth. The fetal heartbeat will be listened to. Any test results from the previous visit will be discussed.  Your health care provider may ask you: How you are feeling. If you are feeling the baby move. If you have had any abnormal symptoms, such as leaking fluid, bleeding, severe headaches, or abdominal cramping. If you are using any tobacco products, including cigarettes, chewing tobacco, and electronic cigarettes. If you have any questions.  Other tests that may be performed during  your second trimester include: Blood tests that check for: Low iron levels (anemia). High blood sugar that affects pregnant women (gestational diabetes) between 71 and 28 weeks. Rh antibodies. This is to check for a protein on red blood cells (Rh factor). Urine tests to check for infections, diabetes, or protein in the urine. An ultrasound to confirm the proper growth and development of the baby. An amniocentesis to check for possible genetic problems. Fetal screens for spina bifida and Down syndrome. HIV (human immunodeficiency virus) testing. Routine prenatal testing includes screening for HIV, unless you choose not to have this test.  Follow these instructions at home: Medicines Follow your health care provider's instructions regarding medicine use. Specific medicines may be either safe or unsafe to take during  pregnancy. Take a prenatal vitamin that contains at least 600 micrograms (mcg) of folic acid. If you develop constipation, try taking a stool softener if your health care provider approves. Eating and drinking Eat a balanced diet that includes fresh fruits and vegetables, whole grains, good sources of protein such as meat, eggs, or tofu, and low-fat dairy. Your health care provider will help you determine the amount of weight gain that is right for you. Avoid raw meat and uncooked cheese. These carry germs that can cause birth defects in the baby. If you have low calcium intake from food, talk to your health care provider about whether you should take a daily calcium supplement. Limit foods that are high in fat and processed sugars, such as fried and sweet foods. To prevent constipation: Drink enough fluid to keep your urine clear or pale yellow. Eat foods that are high in fiber, such as fresh fruits and vegetables, whole grains, and beans. Activity Exercise only as directed by your health care provider. Most women can continue their usual exercise routine during pregnancy. Try to exercise for 30 minutes at least 5 days a week. Stop exercising if you experience uterine contractions. Avoid heavy lifting, wear low heel shoes, and practice good posture. A sexual relationship may be continued unless your health care provider directs you otherwise. Relieving pain and discomfort Wear a good support bra to prevent discomfort from breast tenderness. Take warm sitz baths to soothe any pain or discomfort caused by hemorrhoids. Use hemorrhoid cream if your health care provider approves. Rest with your legs elevated if you have leg cramps or low back pain. If you develop varicose veins, wear support hose. Elevate your feet for 15 minutes, 3-4 times a day. Limit salt in your diet. Prenatal Care Write down your questions. Take them to your prenatal visits. Keep all your prenatal visits as told by your health  care provider. This is important. Safety Wear your seat belt at all times when driving. Make a list of emergency phone numbers, including numbers for family, friends, the hospital, and police and fire departments. General instructions Ask your health care provider for a referral to a local prenatal education class. Begin classes no later than the beginning of month 6 of your pregnancy. Ask for help if you have counseling or nutritional needs during pregnancy. Your health care provider can offer advice or refer you to specialists for help with various needs. Do not use hot tubs, steam rooms, or saunas. Do not douche or use tampons or scented sanitary pads. Do not cross your legs for long periods of time. Avoid cat litter boxes and soil used by cats. These carry germs that can cause birth defects in the baby and possibly loss of the  fetus by miscarriage or stillbirth. Avoid all smoking, herbs, alcohol, and unprescribed drugs. Chemicals in these products can affect the formation and growth of the baby. Do not use any products that contain nicotine or tobacco, such as cigarettes and e-cigarettes. If you need help quitting, ask your health care provider. Visit your dentist if you have not gone yet during your pregnancy. Use a soft toothbrush to brush your teeth and be gentle when you floss. Contact a health care provider if: You have dizziness. You have mild pelvic cramps, pelvic pressure, or nagging pain in the abdominal area. You have persistent nausea, vomiting, or diarrhea. You have a bad smelling vaginal discharge. You have pain when you urinate. Get help right away if: You have a fever. You are leaking fluid from your vagina. You have spotting or bleeding from your vagina. You have severe abdominal cramping or pain. You have rapid weight gain or weight loss. You have shortness of breath with chest pain. You notice sudden or extreme swelling of your face, hands, ankles, feet, or legs. You  have not felt your baby move in over an hour. You have severe headaches that do not go away when you take medicine. You have vision changes. Summary The second trimester is from week 14 through week 27 (months 4 through 6). It is also a time when the fetus is growing rapidly. Your body goes through many changes during pregnancy. The changes vary from woman to woman. Avoid all smoking, herbs, alcohol, and unprescribed drugs. These chemicals affect the formation and growth your baby. Do not use any tobacco products, such as cigarettes, chewing tobacco, and e-cigarettes. If you need help quitting, ask your health care provider. Contact your health care provider if you have any questions. Keep all prenatal visits as told by your health care provider. This is important. This information is not intended to replace advice given to you by your health care provider. Make sure you discuss any questions you have with your health care provider. Document Released: 08/02/2001 Document Revised: 01/14/2016 Document Reviewed: 10/09/2012 Elsevier Interactive Patient Education  2017 [employer].

## 2024-06-03 NOTE — Progress Notes (Signed)
 LOW-RISK PREGNANCY VISIT Patient name: Deanna Welch MRN [id number]  Date of birth: 2000-10-19 Chief Complaint:   Routine Prenatal Visit (Light spotting with wiping only occasionally)  History of Present Illness:   Deanna Welch is a 23 y.o. G1P0 female at [redacted]w[redacted]d with an Estimated Date of Delivery: 11/18/24 being seen today for ongoing management of a low-risk pregnancy.   Today she reports light spotting w/ wiping occasionally, no abnormal d/c, itching/odor/irritation, not related to sex. Contractions: Not present. Vag. Bleeding: Scant (only after using bathroom and occasionally).  Movement: Present. denies leaking of fluid.     05/06/2024   11:47 AM 09/09/2019    1:41 PM  Depression screen PHQ 2/9  Decreased Interest 3 2  Down, Depressed, Hopeless 1 2  PHQ - 2 Score 4 4  Altered sleeping 3 2  Tired, decreased energy 3 0  Change in appetite 3 0  Feeling bad or failure about yourself  0 2  Trouble concentrating 0 2  Moving slowly or fidgety/restless 0 0  Suicidal thoughts 0   PHQ-9 Score 13 10        05/06/2024   11:47 AM  GAD 7 : Generalized Anxiety Score  Nervous, Anxious, on Edge 1  Control/stop worrying 1  Worry too much - different things 1  Trouble relaxing 2  Restless 1  Easily annoyed or irritable 3  Afraid - awful might happen 1  Total GAD 7 Score 10      Review of Systems:   Pertinent items are noted in HPI Denies abnormal vaginal discharge w/ itching/odor/irritation, headaches, visual changes, shortness of breath, chest pain, abdominal pain, severe nausea/vomiting, or problems with urination or bowel movements unless otherwise stated above. Pertinent History Reviewed:  Reviewed past medical,surgical, social, obstetrical and family history.  Reviewed problem list, medications and allergies. Physical Assessment:   Vitals:   06/03/24 0917  BP: 118/71  Pulse: (!) 121  Weight: 121 lb (54.9 kg)  There is no height or weight on file to calculate BMI.         Physical Examination:   General appearance: Well appearing, and in no distress  Mental status: Alert, oriented to person, place, and time  Skin: Warm & dry  Cardiovascular: Normal heart rate noted  Respiratory: Normal respiratory effort, no distress  Abdomen: Soft, gravid, nontender  Pelvic: spec exam: mod amount thick clumpy d/c and also thinner d/c         Extremities: Edema: None  Fetal Status: Fetal Heart Rate (bpm): 163   Movement: Present    Chaperone: Latisha Cresenzo No results found for this or any previous visit (from the past 24 hours).  Assessment & Plan:  1) Low-risk pregnancy G1P0 at [redacted]w[redacted]d with an Estimated Date of Delivery: 11/18/24   2) Occasional spotting, CV swab   Meds: No orders of the defined types were placed in this encounter.  Labs/procedures today: spec exam and CV swab  Plan:  Continue routine obstetrical care  Next visit: prefers will be in person for u/s    Reviewed: Preterm labor symptoms and general obstetric precautions including but not limited to vaginal bleeding, contractions, leaking of fluid and fetal movement were reviewed in detail with the patient.  All questions were answered. Does have home bp cuff. Office bp cuff given: not applicable. Check bp weekly, let us  know if consistently >140 and/or >90.  Follow-up: Return for As scheduled.  Future Appointments  Date Time Provider Department Center  07/01/2024 10:00 AM [facility] US  [facility] None  07/01/2024 11:10 AM Sarahelizabeth Conway, Suzen SAUNDERS, CNM [facility]    No orders of the defined types were placed in this encounter.  Suzen SAUNDERS Fetters CNM, [facility] 06/03/2024 9:28 AM

## 2024-06-04 ENCOUNTER — Ambulatory Visit: Payer: Self-pay | Admitting: Women's Health

## 2024-06-04 LAB — CERVICOVAGINAL ANCILLARY ONLY
Bacterial Vaginitis (gardnerella): POSITIVE — AB
Candida Glabrata: NEGATIVE
Candida Vaginitis: POSITIVE — AB
Chlamydia: NEGATIVE
Comment: NEGATIVE
Comment: NEGATIVE
Comment: NEGATIVE
Comment: NEGATIVE
Comment: NEGATIVE
Comment: NORMAL
Neisseria Gonorrhea: NEGATIVE
Trichomonas: NEGATIVE

## 2024-06-04 MED ORDER — METRONIDAZOLE 500 MG PO TABS: 500.0000 mg | ORAL_TABLET | Freq: Two times a day (BID) | ORAL | 0 refills | Status: DC

## 2024-06-04 MED ORDER — MICONAZOLE NITRATE 2 % VA CREA: 1.0000 | TOPICAL_CREAM | Freq: Every day | VAGINAL | 0 refills | Status: DC

## 2024-07-01 ENCOUNTER — Ambulatory Visit

## 2024-07-01 ENCOUNTER — Encounter: Payer: Self-pay | Admitting: Women's Health

## 2024-07-01 ENCOUNTER — Ambulatory Visit (INDEPENDENT_AMBULATORY_CARE_PROVIDER_SITE_OTHER): Admitting: Women's Health

## 2024-07-01 VITALS — BP 123/78 | HR 116 | Wt 122.0 lb

## 2024-07-01 DIAGNOSIS — Z3A20 20 weeks gestation of pregnancy: Secondary | ICD-10-CM

## 2024-07-01 DIAGNOSIS — Z3402 Encounter for supervision of normal first pregnancy, second trimester: Secondary | ICD-10-CM

## 2024-07-01 DIAGNOSIS — O361920 Maternal care for other isoimmunization, second trimester, not applicable or unspecified: Secondary | ICD-10-CM | POA: Diagnosis not present

## 2024-07-01 DIAGNOSIS — Z363 Encounter for antenatal screening for malformations: Secondary | ICD-10-CM | POA: Diagnosis not present

## 2024-07-01 MED ORDER — PRENATAL VITAMIN 27-0.8 MG PO TABS
ORAL_TABLET | ORAL | 3 refills | Status: AC
Start: 2024-07-01 — End: ?

## 2024-07-01 NOTE — Progress Notes (Signed)
 US  20 wks,cephalic,posterior placenta gr 0,normal ovaries,CX 2.8 cm,FHR 165 bpm,EFW 318 g 39%,BPD 2%,BPD 9%,anatomy complete

## 2024-07-01 NOTE — Patient Instructions (Signed)
 Deanna Welch, thank you for choosing our office today! We appreciate the opportunity to meet your healthcare needs. You may receive a short survey by mail, e-mail, or through [employer]. If you are happy with your care we would appreciate if you could take just a few minutes to complete the survey questions. We read all of your comments and take your feedback very seriously. Thank you again for choosing our office.  Center for [employer] Team at [facility] at [facility] ([address] [place], [place] [address]) Entrance C, located off of [address] Free 24/7 valet parking  Go to [email or web address] to register for FREE online childbirth classes  Call the office [phone]) or go to [facility] if: You begin to severe cramping Your water breaks.  Sometimes it is a big gush of fluid, sometimes it is just a trickle that keeps getting your panties wet or running down your legs You have vaginal bleeding.  It is normal to have a small amount of spotting if your cervix was checked.   [facility] Pediatricians/Family Doctors [facility]): [address] [place], [phone]           [facility]: [address], [phone]                [facility]): [address], [phone] (call to ask if accepting patients) [facility] Department: [address], [place], [phone]    [facility] Pediatricians/Family Doctors [facility]): [phone] [address], [phone] [facility]: [address] [place], [phone] [facility]: [address], [phone]  [facility] Doctors  [facility]): [phone] [facility]: [address], [phone]   [facility] Doctors [facility]: [address], [phone]  [facility] Doctors  [facility]: [phone], [phone]  Home Blood Pressure Monitoring for Patients   Your provider has recommended that you check your blood pressure (BP) at least once a week at home. If you do not have a blood pressure cuff at home, one will be provided for you. Contact your provider if you have not received your monitor within 1 week.   Helpful Tips for Accurate Home Blood Pressure Checks  Don't smoke, exercise, or drink caffeine 30 minutes before checking your BP Use the restroom before checking your BP (a full bladder can raise your pressure) Relax in a comfortable upright chair Feet on the ground Left arm resting comfortably on a flat surface at the level of your heart Legs uncrossed Back supported Sit quietly and don't talk Place the cuff on your bare arm Adjust snuggly, so that only two fingertips can fit between your skin and the top of the cuff Check 2 readings separated by at least one minute Keep a log of your BP readings For a visual, please reference this diagram: http://ccnc.care/bpdiagram  Provider Name: [facility]     Phone: [phone]  Zone 1: ALL CLEAR  Continue to monitor your symptoms:  BP reading is less than 140 (top number) or less than 90 (bottom number)  No right upper stomach pain No headaches or seeing spots No feeling nauseated or throwing up No swelling in face and hands  Zone 2: CAUTION Call your doctor's office for any of the following:  BP reading is greater than 140 (top number) or greater than  90 (bottom number)  Stomach pain under your ribs in the middle or right side Headaches or seeing spots Feeling nauseated or throwing up Swelling in face and hands  Zone 3: EMERGENCY  Seek immediate medical care if you have any of the following:  BP reading is greater than160 (top number) or greater than 110 (bottom number) Severe headaches not improving with Tylenol  Serious difficulty catching your breath Any worsening symptoms from  Zone 2     Second Trimester of Pregnancy The second trimester is from week 14 through week 27 (months 4 through 6). The second trimester is often a time when you feel your best. Your body has adjusted to being pregnant, and you begin to feel better physically. Usually, morning sickness has lessened or quit completely, you may have more energy, and you may have an increase in appetite. The second trimester is also a time when the fetus is growing rapidly. At the end of the sixth month, the fetus is about 9 inches long and weighs about 1 pounds. You will likely begin to feel the baby move (quickening) between 16 and 20 weeks of pregnancy. Body changes during your second trimester Your body continues to go through many changes during your second trimester. The changes vary from woman to woman. Your weight will continue to increase. You will notice your lower abdomen bulging out. You may begin to get stretch marks on your hips, abdomen, and breasts. You may develop headaches that can be relieved by medicines. The medicines should be approved by your health care provider. You may urinate more often because the fetus is pressing on your bladder. You may develop or continue to have heartburn as a result of your pregnancy. You may develop constipation because certain hormones are causing the muscles that push waste through your intestines to slow down. You may develop hemorrhoids or swollen, bulging veins (varicose veins). You may have back pain. This is caused by: Weight gain. Pregnancy hormones that are relaxing the joints in your pelvis. A shift in weight and the muscles that support your balance. Your breasts will continue to grow and they will continue to become tender. Your gums may bleed and may be sensitive to brushing and flossing. Dark spots or blotches (chloasma, mask of pregnancy) may develop on your face. This will likely fade after the baby is born. A dark line from your belly button to  the pubic area (linea nigra) may appear. This will likely fade after the baby is born. You may have changes in your hair. These can include thickening of your hair, rapid growth, and changes in texture. Some women also have hair loss during or after pregnancy, or hair that feels dry or thin. Your hair will most likely return to normal after your baby is born.  What to expect at prenatal visits During a routine prenatal visit: You will be weighed to make sure you and the fetus are growing normally. Your blood pressure will be taken. Your abdomen will be measured to track your baby's growth. The fetal heartbeat will be listened to. Any test results from the previous visit will be discussed.  Your health care provider may ask you: How you are feeling. If you are feeling the baby move. If you have had any abnormal symptoms, such as leaking fluid, bleeding, severe headaches, or abdominal cramping. If you are using any tobacco products, including cigarettes, chewing tobacco, and electronic cigarettes. If you have any questions.  Other tests that may be performed during  your second trimester include: Blood tests that check for: Low iron levels (anemia). High blood sugar that affects pregnant women (gestational diabetes) between 71 and 28 weeks. Rh antibodies. This is to check for a protein on red blood cells (Rh factor). Urine tests to check for infections, diabetes, or protein in the urine. An ultrasound to confirm the proper growth and development of the baby. An amniocentesis to check for possible genetic problems. Fetal screens for spina bifida and Down syndrome. HIV (human immunodeficiency virus) testing. Routine prenatal testing includes screening for HIV, unless you choose not to have this test.  Follow these instructions at home: Medicines Follow your health care provider's instructions regarding medicine use. Specific medicines may be either safe or unsafe to take during  pregnancy. Take a prenatal vitamin that contains at least 600 micrograms (mcg) of folic acid. If you develop constipation, try taking a stool softener if your health care provider approves. Eating and drinking Eat a balanced diet that includes fresh fruits and vegetables, whole grains, good sources of protein such as meat, eggs, or tofu, and low-fat dairy. Your health care provider will help you determine the amount of weight gain that is right for you. Avoid raw meat and uncooked cheese. These carry germs that can cause birth defects in the baby. If you have low calcium intake from food, talk to your health care provider about whether you should take a daily calcium supplement. Limit foods that are high in fat and processed sugars, such as fried and sweet foods. To prevent constipation: Drink enough fluid to keep your urine clear or pale yellow. Eat foods that are high in fiber, such as fresh fruits and vegetables, whole grains, and beans. Activity Exercise only as directed by your health care provider. Most women can continue their usual exercise routine during pregnancy. Try to exercise for 30 minutes at least 5 days a week. Stop exercising if you experience uterine contractions. Avoid heavy lifting, wear low heel shoes, and practice good posture. A sexual relationship may be continued unless your health care provider directs you otherwise. Relieving pain and discomfort Wear a good support bra to prevent discomfort from breast tenderness. Take warm sitz baths to soothe any pain or discomfort caused by hemorrhoids. Use hemorrhoid cream if your health care provider approves. Rest with your legs elevated if you have leg cramps or low back pain. If you develop varicose veins, wear support hose. Elevate your feet for 15 minutes, 3-4 times a day. Limit salt in your diet. Prenatal Care Write down your questions. Take them to your prenatal visits. Keep all your prenatal visits as told by your health  care provider. This is important. Safety Wear your seat belt at all times when driving. Make a list of emergency phone numbers, including numbers for family, friends, the hospital, and police and fire departments. General instructions Ask your health care provider for a referral to a local prenatal education class. Begin classes no later than the beginning of month 6 of your pregnancy. Ask for help if you have counseling or nutritional needs during pregnancy. Your health care provider can offer advice or refer you to specialists for help with various needs. Do not use hot tubs, steam rooms, or saunas. Do not douche or use tampons or scented sanitary pads. Do not cross your legs for long periods of time. Avoid cat litter boxes and soil used by cats. These carry germs that can cause birth defects in the baby and possibly loss of the  fetus by miscarriage or stillbirth. Avoid all smoking, herbs, alcohol, and unprescribed drugs. Chemicals in these products can affect the formation and growth of the baby. Do not use any products that contain nicotine or tobacco, such as cigarettes and e-cigarettes. If you need help quitting, ask your health care provider. Visit your dentist if you have not gone yet during your pregnancy. Use a soft toothbrush to brush your teeth and be gentle when you floss. Contact a health care provider if: You have dizziness. You have mild pelvic cramps, pelvic pressure, or nagging pain in the abdominal area. You have persistent nausea, vomiting, or diarrhea. You have a bad smelling vaginal discharge. You have pain when you urinate. Get help right away if: You have a fever. You are leaking fluid from your vagina. You have spotting or bleeding from your vagina. You have severe abdominal cramping or pain. You have rapid weight gain or weight loss. You have shortness of breath with chest pain. You notice sudden or extreme swelling of your face, hands, ankles, feet, or legs. You  have not felt your baby move in over an hour. You have severe headaches that do not go away when you take medicine. You have vision changes. Summary The second trimester is from week 14 through week 27 (months 4 through 6). It is also a time when the fetus is growing rapidly. Your body goes through many changes during pregnancy. The changes vary from woman to woman. Avoid all smoking, herbs, alcohol, and unprescribed drugs. These chemicals affect the formation and growth your baby. Do not use any tobacco products, such as cigarettes, chewing tobacco, and e-cigarettes. If you need help quitting, ask your health care provider. Contact your health care provider if you have any questions. Keep all prenatal visits as told by your health care provider. This is important. This information is not intended to replace advice given to you by your health care provider. Make sure you discuss any questions you have with your health care provider. Document Released: 08/02/2001 Document Revised: 01/14/2016 Document Reviewed: 10/09/2012 Elsevier Interactive Patient Education  2017 [employer].

## 2024-07-01 NOTE — Progress Notes (Signed)
 LOW-RISK PREGNANCY VISIT Patient name: Deanna Welch MRN [id number]  Date of birth: [birth year] Chief Complaint:   Routine Prenatal Visit  History of Present Illness:   Deanna Welch is a 23 y.o. G1P0 female at [redacted]w[redacted]d with an Estimated Date of Delivery: 11/18/24 being seen today for ongoing management of a low-risk pregnancy.   Today she reports no complaints. Contractions: Not present. Vag. Bleeding: None.  Movement: Present. denies leaking of fluid.     05/06/2024   11:47 AM 09/09/2019    1:41 PM  Depression screen PHQ 2/9  Decreased Interest 3 2  Down, Depressed, Hopeless 1 2  PHQ - 2 Score 4 4  Altered sleeping 3 2  Tired, decreased energy 3 0  Change in appetite 3 0  Feeling bad or failure about yourself  0 2  Trouble concentrating 0 2  Moving slowly or fidgety/restless 0 0  Suicidal thoughts 0   PHQ-9 Score 13  10      Data saved with a previous flowsheet row definition        05/06/2024   11:47 AM  GAD 7 : Generalized Anxiety Score  Nervous, Anxious, on Edge 1  Control/stop worrying 1  Worry too much - different things 1  Trouble relaxing 2  Restless 1  Easily annoyed or irritable 3  Afraid - awful might happen 1  Total GAD 7 Score 10      Review of Systems:   Pertinent items are noted in HPI Denies abnormal vaginal discharge w/ itching/odor/irritation, headaches, visual changes, shortness of breath, chest pain, abdominal pain, severe nausea/vomiting, or problems with urination or bowel movements unless otherwise stated above. Pertinent History Reviewed:  Reviewed past medical,surgical, social, obstetrical and family history.  Reviewed problem list, medications and allergies. Physical Assessment:   Vitals:   07/01/24 1102  BP: 123/78  Pulse: (!) 116  Weight: 122 lb (55.3 kg)  There is no height or weight on file to calculate BMI.        Physical Examination:   General appearance: Well appearing, and in no distress  Mental status: Alert, oriented to  person, place, and time  Skin: Warm & dry  Cardiovascular: Normal heart rate noted  Respiratory: Normal respiratory effort, no distress  Abdomen: Soft, gravid, nontender  Pelvic: Cervical exam deferred         Extremities:    Fetal Status:     Movement: Present  US  20 wks,cephalic,posterior placenta gr 0,normal ovaries,CX 2.8 cm,FHR 165 bpm,EFW 318 g 39%,BPD 2%,BPD 9%,anatomy complete   Chaperone: N/A No results found for this or any previous visit (from the past 24 hours).  Assessment & Plan:  1) Low-risk pregnancy G1P0 at [redacted]w[redacted]d with an Estimated Date of Delivery: 11/18/24   2) +Anti E antibody, 1:4 at new ob, repeat today   Meds:  Meds ordered this encounter  Medications   Prenatal Vit-Fe Fumarate-FA (PRENATAL VITAMIN) 27-0.8 MG TABS    Sig: 1 tablet by mouth daily    Dispense:  90 tablet    Refill:  3   Labs/procedures today: U/S and rpt antibody screen  Plan:  Continue routine obstetrical care  Next visit: prefers in person    Reviewed: Preterm labor symptoms and general obstetric precautions including but not limited to vaginal bleeding, contractions, leaking of fluid and fetal movement were reviewed in detail with the patient.  All questions were answered. Does have home bp cuff. Office bp cuff given: not applicable. Check bp weekly, let us   know if consistently >140 and/or >90.  Follow-up: Return in about 4 weeks (around 07/29/2024) for LROB, CNM, in person.  No future appointments.  Orders Placed This Encounter  Procedures   Antibody screen   Suzen JONELLE Fetters CNM, [facility] 07/01/2024 11:24 AM

## 2024-07-05 LAB — ANTIBODY SCREEN

## 2024-07-05 LAB — AB SCR+ANTIBODY ID
Antibody Screen: POSITIVE — AB
Coombs Titer #1: 2

## 2024-07-09 ENCOUNTER — Ambulatory Visit: Payer: Self-pay | Admitting: Advanced Practice Midwife

## 2024-07-29 ENCOUNTER — Encounter: Admitting: Advanced Practice Midwife

## 2024-08-05 ENCOUNTER — Ambulatory Visit: Admitting: Women's Health

## 2024-08-05 ENCOUNTER — Encounter: Payer: Self-pay | Admitting: Women's Health

## 2024-08-05 VITALS — BP 109/66 | HR 106 | Wt 124.4 lb

## 2024-08-05 DIAGNOSIS — Z3402 Encounter for supervision of normal first pregnancy, second trimester: Secondary | ICD-10-CM | POA: Diagnosis not present

## 2024-08-05 DIAGNOSIS — Z3A25 25 weeks gestation of pregnancy: Secondary | ICD-10-CM | POA: Diagnosis not present

## 2024-08-05 NOTE — Progress Notes (Signed)
 LOW-RISK PREGNANCY VISIT Patient name: Deanna Welch MRN [id number]  Date of birth: [birth year] Chief Complaint:   Routine Prenatal Visit  History of Present Illness:   Deanna Welch is a 23 y.o. G1P0 female at [redacted]w[redacted]d with an Estimated Date of Delivery: 11/18/24 being seen today for ongoing management of a low-risk pregnancy.   Today she reports nosebleeds. Contractions: Not present. Vag. Bleeding: None.  Movement: Present. denies leaking of fluid.     05/06/2024   11:47 AM 09/09/2019    1:41 PM  Depression screen PHQ 2/9  Decreased Interest 3 2  Down, Depressed, Hopeless 1 2  PHQ - 2 Score 4 4  Altered sleeping 3 2  Tired, decreased energy 3 0  Change in appetite 3 0  Feeling bad or failure about yourself  0 2  Trouble concentrating 0 2  Moving slowly or fidgety/restless 0 0  Suicidal thoughts 0   PHQ-9 Score 13  10      Data saved with a previous flowsheet row definition        05/06/2024   11:47 AM  GAD 7 : Generalized Anxiety Score  Nervous, Anxious, on Edge 1  Control/stop worrying 1  Worry too much - different things 1  Trouble relaxing 2  Restless 1  Easily annoyed or irritable 3  Afraid - awful might happen 1  Total GAD 7 Score 10      Review of Systems:   Pertinent items are noted in HPI Denies abnormal vaginal discharge w/ itching/odor/irritation, headaches, visual changes, shortness of breath, chest pain, abdominal pain, severe nausea/vomiting, or problems with urination or bowel movements unless otherwise stated above. Pertinent History Reviewed:  Reviewed past medical,surgical, social, obstetrical and family history.  Reviewed problem list, medications and allergies. Physical Assessment:   Vitals:   08/05/24 1051  BP: 109/66  Pulse: (!) 106  Weight: 124 lb 6.4 oz (56.4 kg)  There is no height or weight on file to calculate BMI.        Physical Examination:   General appearance: Well appearing, and in no distress  Mental status: Alert, oriented  to person, place, and time  Skin: Warm & dry  Cardiovascular: Normal heart rate noted  Respiratory: Normal respiratory effort, no distress  Abdomen: Soft, gravid, nontender  Pelvic: Cervical exam deferred         Extremities:    Fetal Status: Fetal Heart Rate (bpm): 153 Fundal Height: 23 cm Movement: Present    Chaperone: N/A No results found for this or any previous visit (from the past 24 hours).  Assessment & Plan:  1) Low-risk pregnancy G1P0 at [redacted]w[redacted]d with an Estimated Date of Delivery: 11/18/24   2) Nosebleeds, discussed  3) Anti-E antibody last titer 1:2 (from 1:4), rpt next visit   Meds: No orders of the defined types were placed in this encounter.  Labs/procedures today: none  Plan:  Continue routine obstetrical care  Next visit: prefers will be in person for pn2    Reviewed: Preterm labor symptoms and general obstetric precautions including but not limited to vaginal bleeding, contractions, leaking of fluid and fetal movement were reviewed in detail with the patient.  All questions were answered. Does have home bp cuff. Office bp cuff given: not applicable. Check bp weekly, let us  know if consistently >140 and/or >90.  Follow-up: Return in about 3 weeks (around 08/26/2024) for LROB, PN2, CNM, in person.  No future appointments.  No orders of the defined types were placed in  this encounter.  Suzen JONELLE Fetters CNM, [facility] 08/05/2024 11:17 AM

## 2024-08-05 NOTE — Patient Instructions (Signed)
 Deanna Welch, thank you for choosing our office today! We appreciate the opportunity to meet your healthcare needs. You may receive a short survey by mail, e-mail, or through [employer]. If you are happy with your care we would appreciate if you could take just a few minutes to complete the survey questions. We read all of your comments and take your feedback very seriously. Thank you again for choosing our office.  Center for [employer] Team at [facility]  [facility] at [facility] ([address] [place], [place] [address]) Entrance C, located off of [address] Free 24/7 valet parking   You will have your sugar test next visit.  Please do not eat or drink anything after midnight the night before you come, not even water.  You will be here for at least two hours.  Please make an appointment online for the bloodwork at [email or web address] for 8:00am (or as close to this as possible). Make sure you select the [facility] service center.   CLASSES: Go to [email or web address] to register for classes (childbirth, breastfeeding, waterbirth, infant CPR, daddy bootcamp, etc.)  Call the office [phone]) or go to [facility] if: You begin to have strong, frequent contractions Your water breaks.  Sometimes it is a big gush of fluid, sometimes it is just a trickle that keeps getting your panties wet or running down your legs You have vaginal bleeding.  It is normal to have a small amount of spotting if your cervix was checked.  You don't feel your baby moving like normal.  If you don't, get you something to eat and drink and lay down and focus on feeling your baby move.   If your baby is still not moving like normal, you should call the office or go to [facility].  Call the office [phone]) or go to [facility] hospital for these signs of pre-eclampsia: Severe headache that does not go away with Tylenol  Visual changes- seeing spots, double, blurred vision Pain under your right breast or upper  abdomen that does not go away with Tums or heartburn medicine Nausea and/or vomiting Severe swelling in your hands, feet, and face    [place] [facility]ians/Family Doctors [facility]): [address] [place], [phone]           [facility]: [address], [phone]                [facility]): [address], [phone]  [facility] Department: [address], [place], [phone]    [facility] Pediatricians/Family Doctors [facility]): [address], [phone] [facility]: [address] [place], [phone] [facility]: [address], [phone]  [facility] Doctors  [facility]): [phone] [facility]: [address] [phone]   [facility] Doctors [facility]: [address] [phone]  [facility] Doctors  [facility]: [phone], [phone]  Home Blood Pressure Monitoring for Patients   Your provider has recommended that you check your blood pressure (BP) at least once a week at home. If you do not have a blood pressure cuff at home, one will be provided for you. Contact your provider if you have not received your monitor within 1 week.   Helpful Tips for Accurate Home Blood Pressure Checks  Don't smoke, exercise, or drink caffeine 30 minutes before checking  your BP Use the restroom before checking your BP (a full bladder can raise your pressure) Relax in a comfortable upright chair Feet on the ground Left arm resting comfortably on a flat surface at the level of your heart Legs uncrossed Back supported Sit quietly and don't talk Place the cuff on your bare arm Adjust snuggly, so that only two fingertips can fit between your skin and the top of the cuff Check 2 readings separated by at least one  minute Keep a log of your BP readings For a visual, please reference this diagram: http://ccnc.care/bpdiagram  Provider Name: [facility]     Phone: [phone]  Zone 1: ALL CLEAR  Continue to monitor your symptoms:  BP reading is less than 140 (top number) or less than 90 (bottom number)  No right upper stomach pain No headaches or seeing spots No feeling nauseated or throwing up No swelling in face and hands  Zone 2: CAUTION Call your doctor's office for any of the following:  BP reading is greater than 140 (top number) or greater than 90 (bottom number)  Stomach pain under your ribs in the middle or right side Headaches or seeing spots Feeling nauseated or throwing up Swelling in face and hands  Zone 3: EMERGENCY  Seek immediate medical care if you have any of the following:  BP reading is greater than160 (top number) or greater than 110 (bottom number) Severe headaches not improving with Tylenol  Serious difficulty catching your breath Any worsening symptoms from Zone 2   Second Trimester of Pregnancy The second trimester is from week 13 through week 28, months 4 through 6. The second trimester is often a time when you feel your best. Your body has also adjusted to being pregnant, and you begin to feel better physically. Usually, morning sickness has lessened or quit completely, you may have more energy, and you may have an increase in appetite. The second trimester is also a time when the fetus is growing rapidly. At the end of the sixth month, the fetus is about 9 inches long and weighs about 1 pounds. You will likely begin to feel the baby move (quickening) between 18 and 20 weeks of the pregnancy. BODY CHANGES Your body goes through many changes during pregnancy. The changes vary from woman to woman.  Your weight will continue to increase. You will notice your lower abdomen bulging out. You may begin to get stretch marks on your hips, abdomen, and breasts. You may  develop headaches that can be relieved by medicines approved by your health care provider. You may urinate more often because the fetus is pressing on your bladder. You may develop or continue to have heartburn as a result of your pregnancy. You may develop constipation because certain hormones are causing the muscles that push waste through your intestines to slow down. You may develop hemorrhoids or swollen, bulging veins (varicose veins). You may have back pain because of the weight gain and pregnancy hormones relaxing your joints between the bones in your pelvis and as a result of a shift in weight and the muscles that support your balance. Your breasts will continue to grow and be tender. Your gums may bleed and may be sensitive to brushing and flossing. Dark spots or blotches (chloasma, mask of pregnancy) may develop on your face. This will likely fade after the baby is born. A dark line from your belly button to the pubic area (linea nigra) may appear. This will likely fade after the  baby is born. You may have changes in your hair. These can include thickening of your hair, rapid growth, and changes in texture. Some women also have hair loss during or after pregnancy, or hair that feels dry or thin. Your hair will most likely return to normal after your baby is born. WHAT TO EXPECT AT YOUR PRENATAL VISITS During a routine prenatal visit: You will be weighed to make sure you and the fetus are growing normally. Your blood pressure will be taken. Your abdomen will be measured to track your baby's growth. The fetal heartbeat will be listened to. Any test results from the previous visit will be discussed. Your health care provider may ask you: How you are feeling. If you are feeling the baby move. If you have had any abnormal symptoms, such as leaking fluid, bleeding, severe headaches, or abdominal cramping. If you have any questions. Other tests that may be performed during your second  trimester include: Blood tests that check for: Low iron levels (anemia). Gestational diabetes (between 24 and 28 weeks). Rh antibodies. Urine tests to check for infections, diabetes, or protein in the urine. An ultrasound to confirm the proper growth and development of the baby. An amniocentesis to check for possible genetic problems. Fetal screens for spina bifida and Down syndrome. HOME CARE INSTRUCTIONS  Avoid all smoking, herbs, alcohol, and unprescribed drugs. These chemicals affect the formation and growth of the baby. Follow your health care provider's instructions regarding medicine use. There are medicines that are either safe or unsafe to take during pregnancy. Exercise only as directed by your health care provider. Experiencing uterine cramps is a good sign to stop exercising. Continue to eat regular, healthy meals. Wear a good support bra for breast tenderness. Do not use hot tubs, steam rooms, or saunas. Wear your seat belt at all times when driving. Avoid raw meat, uncooked cheese, cat litter boxes, and soil used by cats. These carry germs that can cause birth defects in the baby. Take your prenatal vitamins. Try taking a stool softener (if your health care provider approves) if you develop constipation. Eat more high-fiber foods, such as fresh vegetables or fruit and whole grains. Drink plenty of fluids to keep your urine clear or pale yellow. Take warm sitz baths to soothe any pain or discomfort caused by hemorrhoids. Use hemorrhoid cream if your health care provider approves. If you develop varicose veins, wear support hose. Elevate your feet for 15 minutes, 3-4 times a day. Limit salt in your diet. Avoid heavy lifting, wear low heel shoes, and practice good posture. Rest with your legs elevated if you have leg cramps or low back pain. Visit your dentist if you have not gone yet during your pregnancy. Use a soft toothbrush to brush your teeth and be gentle when you floss. A  sexual relationship may be continued unless your health care provider directs you otherwise. Continue to go to all your prenatal visits as directed by your health care provider. SEEK MEDICAL CARE IF:  You have dizziness. You have mild pelvic cramps, pelvic pressure, or nagging pain in the abdominal area. You have persistent nausea, vomiting, or diarrhea. You have a bad smelling vaginal discharge. You have pain with urination. SEEK IMMEDIATE MEDICAL CARE IF:  You have a fever. You are leaking fluid from your vagina. You have spotting or bleeding from your vagina. You have severe abdominal cramping or pain. You have rapid weight gain or loss. You have shortness of breath with chest pain. You  notice sudden or extreme swelling of your face, hands, ankles, feet, or legs. You have not felt your baby move in over an hour. You have severe headaches that do not go away with medicine. You have vision changes. Document Released: 08/02/2001 Document Revised: 08/13/2013 Document Reviewed: 10/09/2012 [facility] Patient Information 2015 [place], [place]. This information is not intended to replace advice given to you by your health care provider. Make sure you discuss any questions you have with your health care provider.

## 2024-08-28 ENCOUNTER — Other Ambulatory Visit

## 2024-08-28 ENCOUNTER — Encounter: Admitting: Women's Health

## 2024-08-28 DIAGNOSIS — Z3400 Encounter for supervision of normal first pregnancy, unspecified trimester: Secondary | ICD-10-CM

## 2024-08-28 DIAGNOSIS — Z131 Encounter for screening for diabetes mellitus: Secondary | ICD-10-CM

## 2024-08-28 DIAGNOSIS — Z3A28 28 weeks gestation of pregnancy: Secondary | ICD-10-CM

## 2024-09-05 ENCOUNTER — Ambulatory Visit: Admitting: Advanced Practice Midwife

## 2024-09-05 ENCOUNTER — Encounter: Payer: Self-pay | Admitting: Advanced Practice Midwife

## 2024-09-05 ENCOUNTER — Other Ambulatory Visit

## 2024-09-05 VITALS — BP 128/87 | HR 85 | Wt 129.0 lb

## 2024-09-05 DIAGNOSIS — O26893 Other specified pregnancy related conditions, third trimester: Secondary | ICD-10-CM | POA: Diagnosis not present

## 2024-09-05 DIAGNOSIS — O26843 Uterine size-date discrepancy, third trimester: Secondary | ICD-10-CM

## 2024-09-05 DIAGNOSIS — Z131 Encounter for screening for diabetes mellitus: Secondary | ICD-10-CM

## 2024-09-05 DIAGNOSIS — Z3A29 29 weeks gestation of pregnancy: Secondary | ICD-10-CM

## 2024-09-05 DIAGNOSIS — Z6791 Unspecified blood type, Rh negative: Secondary | ICD-10-CM | POA: Diagnosis not present

## 2024-09-05 DIAGNOSIS — Z3403 Encounter for supervision of normal first pregnancy, third trimester: Secondary | ICD-10-CM

## 2024-09-05 DIAGNOSIS — Z3400 Encounter for supervision of normal first pregnancy, unspecified trimester: Secondary | ICD-10-CM

## 2024-09-05 DIAGNOSIS — O26899 Other specified pregnancy related conditions, unspecified trimester: Secondary | ICD-10-CM

## 2024-09-05 MED ORDER — AMOXICILLIN 500 MG PO CAPS: 500.0000 mg | ORAL_CAPSULE | Freq: Three times a day (TID) | ORAL | 0 refills | Status: AC

## 2024-09-05 NOTE — Progress Notes (Signed)
" ° °  LOW-RISK PREGNANCY VISIT Patient name: Deanna Welch MRN [id number]  Date of birth: 01/08/2001 Chief Complaint:   Routine Prenatal Visit (PN2)  History of Present Illness:   Marquis Diles is a 24 y.o. G1P0 female at [redacted]w[redacted]d with an Estimated Date of Delivery: 11/18/24 being seen today for ongoing management of a low-risk pregnancy.  Today she reports URI sx w/cough for 2 weeks now. Contractions: Not present. Vag. Bleeding: None.  Movement: Present. denies leaking of fluid. Review of Systems:   Pertinent items are noted in HPI Denies abnormal vaginal discharge w/ itching/odor/irritation, headaches, visual changes, shortness of breath, chest pain, abdominal pain, severe nausea/vomiting, or problems with urination or bowel movements unless otherwise stated above. Pertinent History Reviewed:  Reviewed past medical,surgical, social, obstetrical and family history.  Reviewed problem list, medications and allergies.    Physical Assessment:     Vitals:   09/05/24 1003  BP: 128/87  Pulse: 85  Weight: 129 lb (58.5 kg)  There is no height or weight on file to calculate BMI.        Physical Examination:   General appearance: Well appearing, and in no distress  Mental status: Alert, oriented to person, place, and time  Skin: Warm & dry  Cardiovascular: Normal heart rate noted  Respiratory: Normal respiratory effort, no distress  Abdomen: Soft, gravid, nontender  Pelvic: Cervical exam deferred         Extremities: Edema: None Chaperone:  N/A   Fetal Status: Fetal Heart Rate (bpm): 150 Fundal Height: 23 cm Movement: Present      No results found for this or any previous visit (from the past 24 hours).  Assessment & Plan:    Pregnancy: G1P0 at [redacted]w[redacted]d 1. Encounter for supervision of normal first pregnancy in third trimester (Primary)   2. [redacted] weeks gestation of pregnancy   3. Rh negative state in antepartum period  4. Secondary bacterial URI:    Meds:  Meds ordered this  encounter  Medications   amoxicillin  (AMOXIL ) 500 MG capsule    Sig: Take 1 capsule (500 mg total) by mouth 3 (three) times daily.    Dispense:  21 capsule    Refill:  0    Supervising Provider:   JAYNE, LUTHER H [[id number]]   5.  Size<dates:   Orders Placed This Encounter  Procedures   US  OB Follow Up    Standing Status:   Future    Expiration Date:   09/05/2025    Reason for exam::   afi/efw    Preferred imaging location?:   Internal     Labs/procedures today: PN2  Plan:  Continue routine obstetrical care  Next visit: prefers in person    Reviewed: Preterm labor symptoms and general obstetric precautions including but not limited to vaginal bleeding, contractions, leaking of fluid and fetal movement were reviewed in detail with the patient.  All questions were answered. Has home bp cuff.. Check bp weekly, let us  know if >140/90.   Follow-up: Return in about 3 weeks (around 09/26/2024) for LROB/ ASAP for EFW/AFI US .  No future appointments.  Orders Placed This Encounter  Procedures   US  OB Follow Up   Cathlean Ely DNP, CNM 09/05/2024 10:32 AM  "

## 2024-09-05 NOTE — Patient Instructions (Signed)

## 2024-09-06 LAB — AB SCR+ANTIBODY ID

## 2024-09-08 LAB — CBC
Hematocrit: 35.9 % (ref 34.0–46.6)
Hemoglobin: 12 g/dL (ref 11.1–15.9)
MCH: 30 pg (ref 26.6–33.0)
MCHC: 33.4 g/dL (ref 31.5–35.7)
MCV: 90 fL (ref 79–97)
Platelets: 213 x10E3/uL (ref 150–450)
RBC: 4 x10E6/uL (ref 3.77–5.28)
RDW: 11.4 % — ABNORMAL LOW (ref 11.7–15.4)
WBC: 7.3 x10E3/uL (ref 3.4–10.8)

## 2024-09-08 LAB — AB SCR+ANTIBODY ID
Antibody Screen: POSITIVE — AB
Coombs Titer #1: 4

## 2024-09-08 LAB — GLUCOSE TOLERANCE, 2 HOURS W/ 1HR
Glucose, 1 hour: 173 mg/dL (ref 70–179)
Glucose, 2 hour: 101 mg/dL (ref 70–152)
Glucose, Fasting: 82 mg/dL (ref 70–91)

## 2024-09-08 LAB — SYPHILIS: RPR W/REFLEX TO RPR TITER AND TREPONEMAL ANTIBODIES, TRADITIONAL SCREENING AND DIAGNOSIS ALGORITHM: RPR Ser Ql: NONREACTIVE

## 2024-09-08 LAB — HIV ANTIBODY (ROUTINE TESTING W REFLEX): HIV Screen 4th Generation wRfx: NONREACTIVE

## 2024-09-08 LAB — ANTIBODY SCREEN

## 2024-09-09 ENCOUNTER — Encounter: Payer: Self-pay | Admitting: Women's Health

## 2024-09-09 ENCOUNTER — Other Ambulatory Visit: Payer: Self-pay | Admitting: Advanced Practice Midwife

## 2024-09-09 ENCOUNTER — Ambulatory Visit: Payer: Self-pay | Admitting: Women's Health

## 2024-09-09 ENCOUNTER — Other Ambulatory Visit: Payer: Self-pay | Admitting: Women's Health

## 2024-09-09 DIAGNOSIS — Z3A30 30 weeks gestation of pregnancy: Secondary | ICD-10-CM

## 2024-09-09 DIAGNOSIS — O26842 Uterine size-date discrepancy, second trimester: Secondary | ICD-10-CM

## 2024-09-09 DIAGNOSIS — Z3A29 29 weeks gestation of pregnancy: Secondary | ICD-10-CM

## 2024-09-09 DIAGNOSIS — Z6791 Unspecified blood type, Rh negative: Secondary | ICD-10-CM

## 2024-09-09 DIAGNOSIS — O360990 Maternal care for other rhesus isoimmunization, unspecified trimester, not applicable or unspecified: Secondary | ICD-10-CM

## 2024-09-09 DIAGNOSIS — O26843 Uterine size-date discrepancy, third trimester: Secondary | ICD-10-CM

## 2024-09-09 DIAGNOSIS — Z3403 Encounter for supervision of normal first pregnancy, third trimester: Secondary | ICD-10-CM

## 2024-09-10 ENCOUNTER — Inpatient Hospital Stay (HOSPITAL_COMMUNITY)
Admission: AD | Admit: 2024-09-10 | Discharge: 2024-09-11 | Disposition: A | Discharge status: Home / Self Care | Attending: Obstetrics and Gynecology | Admitting: Obstetrics and Gynecology

## 2024-09-10 DIAGNOSIS — Z3689 Encounter for other specified antenatal screening: Secondary | ICD-10-CM

## 2024-09-10 DIAGNOSIS — N898 Other specified noninflammatory disorders of vagina: Secondary | ICD-10-CM | POA: Insufficient documentation

## 2024-09-10 DIAGNOSIS — O26893 Other specified pregnancy related conditions, third trimester: Secondary | ICD-10-CM | POA: Insufficient documentation

## 2024-09-10 DIAGNOSIS — Z711 Person with feared health complaint in whom no diagnosis is made: Secondary | ICD-10-CM

## 2024-09-10 DIAGNOSIS — Z6791 Unspecified blood type, Rh negative: Secondary | ICD-10-CM | POA: Insufficient documentation

## 2024-09-10 DIAGNOSIS — Z3A30 30 weeks gestation of pregnancy: Secondary | ICD-10-CM | POA: Insufficient documentation

## 2024-09-10 NOTE — MAU Provider Note (Signed)
 Chief Complaint:  Vaginal Discharge   HPI   None     Deanna Welch is a 24 y.o. G1P0 at [redacted]w[redacted]d who presents to maternity admissions reporting vaginal discharge. She reports thick clear to-to-yellow mucous-like discharge this evening when she went to the bathroom. She denies vaginal itching or odor. She was concerned that it was her mucous plug and/or could be a sign of labor. Denies vaginal bleeding, leaking of fluid, contractions. Endorses good fetal movement.  Pregnancy Course: Receives care at [facility] for [facility] . Prenatal records reviewed. Pregnancy complicated by Rh negative blood type, anti-E isoimmunization.  Past Medical History:  Diagnosis Date   Anemia    OB History  Gravida Para Term Preterm AB Living  1       SAB IAB Ectopic Multiple Live Births          # Outcome Date GA Lbr Len/2nd Weight Sex Type Anes PTL Lv  1 Current            Past Surgical History:  Procedure Laterality Date   WISDOM TOOTH EXTRACTION     History reviewed. No pertinent family history. Social History[1] Allergies[2] Medications Prior to Admission  Medication Sig Dispense Refill Last Dose/Taking   acetaminophen  (TYLENOL ) 325 MG tablet Take 650 mg by mouth every 6 (six) hours as needed.   Past Month   amoxicillin  (AMOXIL ) 500 MG capsule Take 1 capsule (500 mg total) by mouth 3 (three) times daily. 21 capsule 0 09/10/2024   Prenatal Vit-Fe Fumarate-FA (PRENATAL VITAMIN) 27-0.8 MG TABS 1 tablet by mouth daily 90 tablet 3 09/09/2024   aspirin  EC 81 MG tablet Take 1 tablet (81 mg total) by mouth daily. (Patient not taking: Reported on 09/05/2024) 30 tablet 6    Blood Pressure Monitoring (BLOOD PRESSURE CUFF) MISC 1 kit by Does not apply route as directed. 1 each 0    ondansetron  (ZOFRAN -ODT) 4 MG disintegrating tablet Take 1 tablet (4 mg total) by mouth every 8 (eight) hours as needed for nausea or vomiting. (Patient not taking: Reported on 09/10/2024) 20 tablet 0 Not Taking    I  have reviewed patient's Past Medical Hx, Surgical Hx, Family Hx, Social Hx, medications and allergies.   ROS  Pertinent items noted in HPI and remainder of comprehensive ROS otherwise negative.   PHYSICAL EXAM  Patient Vitals for the past 24 hrs:  BP Temp Pulse Resp SpO2 Height Weight  09/10/24 2339 117/68 -- -- -- -- -- --  09/10/24 2335 -- 99.3 F (37.4 C) 96 17 100 % 5' 5 (1.651 m) 60.5 kg  FHT: 145 per minute  Constitutional: Well-developed, well-nourished female in no acute distress.  Cardiovascular: normal rate & rhythm, warm and well-perfused Respiratory: normal effort, no problems with respiration noted GI: Abd soft, non-tender, non-distended MSK: Extremities nontender, no edema, normal ROM Skin: warm and dry. Acyanotic, no jaundice or pallor. Neurologic: Alert and oriented x 4. No abnormal coordination. Psychiatric: Normal mood. Speech not slurred, not rapid/pressured. Patient is cooperative. Pelvic exam: exam chaperoned by Idell Gunning RN.  Dilation: Closed Effacement (%): Thick Exam by:: Joesph Sear PA  Fetal Tracing: Baseline FHR: 145 per minute Fetal heart variability: moderate Fetal Heart Rate accelerations: yes Fetal Heart Rate decelerations: none Fetal Non-stress Test: Category I (reactive) Toco: no uterine contractions   Labs: No results found for this or any previous visit (from the past 24 hours).  Imaging:  No results found.  MDM & MAU COURSE  MDM: Low  MAU Course: -Vital signs within normal limits. FHT appropriate and reassuring of fetal well-being. -No contractions, vaginal bleeding, or leaking of fluid. Also no uterine contractions on toco. -UA and wet prep to rule out infection.  -No cervical dilation on exam. -Discussed that mucous plug is not a reliable indicator for labor. Reviewed signs and symptoms of labor and when to return to MAU for labor evaluation.  Differential diagnosis considered for vaginal discharge includes but is not limited  to: ROM, bacterial vaginosis, vulvovaginal candidiasis, gonorrhea, chlamydia, physiologic discharge, urinary incontinence   Orders Placed This Encounter  Procedures   Wet prep, genital   Urinalysis, Routine w reflex microscopic -Urine, Clean Catch   Discharge patient   No orders of the defined types were placed in this encounter.   ASSESSMENT   1. Vaginal discharge during pregnancy in third trimester   2. NST (non-stress test) reactive   3. [redacted] weeks gestation of pregnancy     PLAN  Discharge home in stable condition with preterm labor precautions.  Follow up with OB as scheduled for prenatal care. UA and wet prep pending, will contact with abnormal results.    Follow-up Information     [facility] Follow up.   Specialty: Obstetrics and Gynecology Why: As scheduled for ongoing prenatal care Contact information: [address] [place]  [phone]                 Allergies as of 09/11/2024   No Known Allergies      Medication List     TAKE these medications    acetaminophen  325 MG tablet Commonly known as: TYLENOL  Take 650 mg by mouth every 6 (six) hours as needed.   amoxicillin  500 MG capsule Commonly known as: AMOXIL  Take 1 capsule (500 mg total) by mouth 3 (three) times daily.   aspirin  EC 81 MG tablet Take 1 tablet (81 mg total) by mouth daily.   Blood Pressure Cuff Misc 1 kit by Does not apply route as directed.   ondansetron  4 MG disintegrating tablet Commonly known as: ZOFRAN -ODT Take 1 tablet (4 mg total) by mouth every 8 (eight) hours as needed for nausea or vomiting.   Prenatal Vitamin 27-0.8 MG Tabs 1 tablet by mouth daily        Joesph [name], PA      [1]  Social History Tobacco Use   Smoking status: Passive Smoke Exposure - Never Smoker   Smokeless tobacco: Never  Vaping Use   Vaping status: Never Used  Substance Use Topics   Alcohol use:  No   Drug use: No  [2] No Known Allergies

## 2024-09-10 NOTE — MAU Note (Signed)
 Deanna Welch is a 24 y.o. at [redacted]w[redacted]d here in MAU reporting having some vaginal d/c tonight that she thinks may have been her mucous plug. She has had thick, yellow/clear mucousy d/c today when she went to the bathroom. No odor to d/c.  She was concerned it could be a sign of labor. Denies any pain and no LOF or VB. Reports good FM  LMP: na Onset of complaint: this evening Pain score: 0 Vitals:   09/10/24 2335 09/10/24 2339  BP:  117/68  Pulse: 96   Resp: 17   Temp: 99.3 F (37.4 C)   SpO2: 100%      FHT: 145  Lab orders placed from triage: u/a

## 2024-09-10 NOTE — MAU Provider Note (Incomplete)
 Chief Complaint:  Vaginal Discharge   HPI   None     Deanna Welch is a 24 y.o. G1P0 at [redacted]w[redacted]d who presents to maternity admissions reporting vaginal discharge. She reports thick clear to-to-yellow mucous-like discharge this evening when she went to the bathroom. She denies vaginal itching or odor. She was concerned that it was her mucous plug and/or could be a sign of labor. Denies vaginal bleeding, leaking of fluid, contractions. Endorses good fetal movement.  Pregnancy Course: Receives care at [facility] for [facility] . Prenatal records reviewed. Pregnancy complicated by Rh negative blood type, anti-E isoimmunization.  Past Medical History:  Diagnosis Date   Anemia    OB History  Gravida Para Term Preterm AB Living  1       SAB IAB Ectopic Multiple Live Births          # Outcome Date GA Lbr Len/2nd Weight Sex Type Anes PTL Lv  1 Current            Past Surgical History:  Procedure Laterality Date   WISDOM TOOTH EXTRACTION     No family history on file. Social History[1] Allergies[2] Medications Prior to Admission  Medication Sig Dispense Refill Last Dose/Taking   acetaminophen  (TYLENOL ) 325 MG tablet Take 650 mg by mouth every 6 (six) hours as needed.   Past Month   amoxicillin  (AMOXIL ) 500 MG capsule Take 1 capsule (500 mg total) by mouth 3 (three) times daily. 21 capsule 0 09/10/2024   Prenatal Vit-Fe Fumarate-FA (PRENATAL VITAMIN) 27-0.8 MG TABS 1 tablet by mouth daily 90 tablet 3 09/09/2024   aspirin  EC 81 MG tablet Take 1 tablet (81 mg total) by mouth daily. (Patient not taking: Reported on 09/05/2024) 30 tablet 6    Blood Pressure Monitoring (BLOOD PRESSURE CUFF) MISC 1 kit by Does not apply route as directed. 1 each 0    ondansetron  (ZOFRAN -ODT) 4 MG disintegrating tablet Take 1 tablet (4 mg total) by mouth every 8 (eight) hours as needed for nausea or vomiting. (Patient not taking: Reported on 09/10/2024) 20 tablet 0 Not Taking    I have  reviewed patient's Past Medical Hx, Surgical Hx, Family Hx, Social Hx, medications and allergies.   ROS  Pertinent items noted in HPI and remainder of comprehensive ROS otherwise negative.   PHYSICAL EXAM  Patient Vitals for the past 24 hrs:  BP Temp Pulse Resp SpO2 Height Weight  09/10/24 2339 117/68 -- -- -- -- -- --  09/10/24 2335 -- 99.3 F (37.4 C) 96 17 100 % 5' 5 (1.651 m) 60.5 kg  FHT: 145 per minute  Constitutional: Well-developed, well-nourished female in no acute distress.  Cardiovascular: normal rate & rhythm, warm and well-perfused Respiratory: normal effort, no problems with respiration noted GI: Abd soft, non-tender, non-distended MSK: Extremities nontender, no edema, normal ROM Skin: warm and dry. Acyanotic, no jaundice or pallor. Neurologic: Alert and oriented x 4. No abnormal coordination. Psychiatric: Normal mood. Speech not slurred, not rapid/pressured. Patient is cooperative. Pelvic exam: VULVA: {details:315901::normal appearing vulva with no masses, tenderness or lesions}, VAGINA: {details:315903::normal appearing vagina with normal color and discharge, no lesions}, CERVIX: {details:315904::normal appearing cervix without discharge or lesions}, exam chaperoned by ***.      Fetal Tracing: Baseline FHR: *** per minute Fetal heart variability: {:312973} Fetal Heart Rate accelerations: {Responses; yes/no/wildcard:311178} Fetal Heart Rate decelerations: {:310437} Fetal Non-stress Test: {NST Categories:33063} Toco:   Labs: No results found for this or any previous visit (from the past 24  hours).  Imaging:  No results found.  MDM & MAU COURSE  MDM: Low  MAU Course: -Vital signs within normal limits. FHT appropriate and reassuring of fetal well-being. -No contractions, vaginal bleeding, or leaking of fluid. -UA and wet prep to rule out infection.  -Discussed that mucous plug is not a reliable indicator for labor. Reviewed signs and symptoms of labor and  when to return to MAU for labor evaluation.  Differential diagnosis considered for vaginal discharge includes but is not limited to: ROM, bacterial vaginosis, vulvovaginal candidiasis, gonorrhea, chlamydia, physiologic discharge, urinary incontinence   Orders Placed This Encounter  Procedures   Wet prep, genital   Urinalysis, Routine w reflex microscopic -Urine, Clean Catch   Discharge patient   No orders of the defined types were placed in this encounter.   ASSESSMENT   1. Vaginal discharge during pregnancy in third trimester   2. Worried well   3. [redacted] weeks gestation of pregnancy     PLAN  Discharge home in stable condition with preterm labor precautions.  Follow up with OB as scheduled for prenatal care.    Follow-up Information     [facility] Follow up.   Specialty: Obstetrics and Gynecology Why: As scheduled for ongoing prenatal care Contact information: [address] [place]  [address] [phone]                 Allergies as of 09/11/2024   No Known Allergies      Medication List     TAKE these medications    acetaminophen  325 MG tablet Commonly known as: TYLENOL  Take 650 mg by mouth every 6 (six) hours as needed.   amoxicillin  500 MG capsule Commonly known as: AMOXIL  Take 1 capsule (500 mg total) by mouth 3 (three) times daily.   aspirin  EC 81 MG tablet Take 1 tablet (81 mg total) by mouth daily.   Blood Pressure Cuff Misc 1 kit by Does not apply route as directed.   ondansetron  4 MG disintegrating tablet Commonly known as: ZOFRAN -ODT Take 1 tablet (4 mg total) by mouth every 8 (eight) hours as needed for nausea or vomiting.   Prenatal Vitamin 27-0.8 MG Tabs 1 tablet by mouth daily        Deanna [name], PA        [1] Social History Tobacco Use   Smoking status: Passive Smoke Exposure - Never Smoker   Smokeless tobacco: Never  Vaping Use    Vaping status: Never Used  Substance Use Topics   Alcohol use: No   Drug use: No  [2] No Known Allergies

## 2024-09-11 ENCOUNTER — Other Ambulatory Visit: Admitting: Radiology

## 2024-09-11 ENCOUNTER — Encounter (HOSPITAL_COMMUNITY): Payer: Self-pay | Admitting: Obstetrics and Gynecology

## 2024-09-11 ENCOUNTER — Other Ambulatory Visit

## 2024-09-11 DIAGNOSIS — N898 Other specified noninflammatory disorders of vagina: Secondary | ICD-10-CM

## 2024-09-11 DIAGNOSIS — O321XX0 Maternal care for breech presentation, not applicable or unspecified: Secondary | ICD-10-CM | POA: Diagnosis not present

## 2024-09-11 DIAGNOSIS — Z6791 Unspecified blood type, Rh negative: Secondary | ICD-10-CM | POA: Diagnosis not present

## 2024-09-11 DIAGNOSIS — O26842 Uterine size-date discrepancy, second trimester: Secondary | ICD-10-CM

## 2024-09-11 DIAGNOSIS — Z3A30 30 weeks gestation of pregnancy: Secondary | ICD-10-CM

## 2024-09-11 DIAGNOSIS — Z3A29 29 weeks gestation of pregnancy: Secondary | ICD-10-CM

## 2024-09-11 DIAGNOSIS — O26893 Other specified pregnancy related conditions, third trimester: Secondary | ICD-10-CM

## 2024-09-11 DIAGNOSIS — O26843 Uterine size-date discrepancy, third trimester: Secondary | ICD-10-CM

## 2024-09-11 LAB — WET PREP, GENITAL
Clue Cells Wet Prep HPF POC: NONE SEEN
Sperm: NONE SEEN
Trich, Wet Prep: NONE SEEN
WBC, Wet Prep HPF POC: >=10 — AB

## 2024-09-11 LAB — URINALYSIS, ROUTINE W REFLEX MICROSCOPIC
Bacteria, UA: NONE SEEN
Bilirubin Urine: NEGATIVE
Glucose, UA: NEGATIVE mg/dL
Hgb urine dipstick: NEGATIVE
Ketones, ur: NEGATIVE mg/dL
Nitrite: NEGATIVE
Protein, ur: NEGATIVE mg/dL
Specific Gravity, Urine: 1.012 (ref 1.005–1.030)
pH: 6 (ref 5.0–8.0)

## 2024-09-11 NOTE — Discharge Instructions (Signed)
 Reasons to return to MAU at [facility]: Less than 36 weeks: Contractions feels like menstrual cramps. You should go to the hospital if you have more than 6 contractions in an hour, even after you have rested and drank at least 16 ounces of water.  More than 36 weeks: You begin to have strong, frequent contractions 5 minutes apart or less, each last 1 minute, these have been going on for 1-2 hours, and you cannot walk or talk during them. Your water breaks.  Sometimes it is a big gush of fluid. However, many times it may it may be much more subtle. You should go to the hospital if you have a constant leakage of fluid from your vagina, enough to soak a pad when you are walking around.  You have vaginal bleeding.  It is normal to have a small amount of spotting if your cervix was checked. If you have bleeding requiring the use of a pad, go to the hospital. You don't feel your baby moving like normal.  If you think that you babys movement is decreased, eat a snack and rest on your left side in a quiet room for one hour. If you have not felt the baby move more than 6 times in an hour GO TO THE HOSPITAL.

## 2024-09-11 NOTE — Progress Notes (Signed)
 US : GA = 30+2 weeks Single active female fetus, frank breech, FHR = 142 bpm, posterior pl, gr1, AFI = 12cm, MVP = 3.5 cm, EFW 18%, AC 32%, cervix closed

## 2024-09-11 NOTE — Progress Notes (Signed)
 Written and verbal d/c instructions given and pt voiced understanding.

## 2024-09-20 ENCOUNTER — Ambulatory Visit

## 2024-09-20 ENCOUNTER — Ambulatory Visit: Attending: Obstetrics and Gynecology | Admitting: Obstetrics and Gynecology

## 2024-09-20 ENCOUNTER — Other Ambulatory Visit: Payer: Self-pay | Admitting: Women's Health

## 2024-09-20 VITALS — BP 122/72 | HR 106

## 2024-09-20 DIAGNOSIS — O360990 Maternal care for other rhesus isoimmunization, unspecified trimester, not applicable or unspecified: Secondary | ICD-10-CM

## 2024-09-20 DIAGNOSIS — Z3A31 31 weeks gestation of pregnancy: Secondary | ICD-10-CM | POA: Diagnosis not present

## 2024-09-20 DIAGNOSIS — O360923 Maternal care for other rhesus isoimmunization, second trimester, fetus 3: Secondary | ICD-10-CM | POA: Insufficient documentation

## 2024-09-20 DIAGNOSIS — Z363 Encounter for antenatal screening for malformations: Secondary | ICD-10-CM | POA: Insufficient documentation

## 2024-09-20 DIAGNOSIS — O360930 Maternal care for other rhesus isoimmunization, third trimester, not applicable or unspecified: Secondary | ICD-10-CM | POA: Diagnosis not present

## 2024-09-20 NOTE — Progress Notes (Signed)
 Maternal-Fetal Medicine Consultation  Name: Deanna Welch  MRN: [id number]  GA: G1P0 [redacted]w[redacted]d   Patient is here for limited ultrasound evaluation and middle cerebral artery (MCA) Doppler study. She had normal fetal anatomical survey at your office.  Routine screening showed positive anti-E antibodies (1:4).  Her blood type is A negative and anti-D antibodies are not present.  This is her first pregnancy.  Patient does not give history of miscarriage or abortions.  No history of blood transfusions or blood products.  She had opted not to screen for fetal aneuploidies.  Ultrasound A limited ultrasound study was performed.  Normal amniotic fluid.  No evidence of fetal hydrops.  Placenta appears normal with no evidence of placentomegaly.  Middle cerebral artery showed normal peak systolic velocity measurements (no evidence of anemia.  I reassured the patient of the findings.  I explained the significance of MCA Doppler study that is a noninvasive method of finding fetal anemia.  I explained the pathophysiology of fetal hemolysis if the baby is E positive.  I counseled the patient on the possible ways of acquiring anti-E antibodies.  Options include: -Partner screening for E antigen and if he is negative, no further MCA surveillance is necessary. OR -I discussed UNITY screening to determine fetal E antigen status and if E negative, no further surveillance is required OR -Follow-up MCA Doppler study in 4 weeks.  Patient opted to return for follow-up ultrasound in 4 weeks.  Recommendations -An appointment was made for her to return in 4 weeks for MCA Doppler study. - Fetal growth assessment (and rule out hydrops) at your office in 2 to 3 weeks. - Patient is A negative and should receive anti-D immunoglobulin (Rhogam) now.     Consultation including face-to-face (more than 50%) counseling 30 minutes.

## 2024-09-24 ENCOUNTER — Encounter: Payer: Self-pay | Admitting: Women's Health

## 2024-09-25 ENCOUNTER — Other Ambulatory Visit: Payer: Self-pay | Admitting: Women's Health

## 2024-09-25 DIAGNOSIS — O360921 Maternal care for other rhesus isoimmunization, second trimester, fetus 1: Secondary | ICD-10-CM

## 2024-09-26 ENCOUNTER — Encounter: Payer: Self-pay | Admitting: Advanced Practice Midwife

## 2024-09-26 ENCOUNTER — Encounter: Admitting: Women's Health

## 2024-09-26 ENCOUNTER — Ambulatory Visit: Admitting: Advanced Practice Midwife

## 2024-09-26 ENCOUNTER — Other Ambulatory Visit

## 2024-09-26 VITALS — BP 107/73 | HR 94 | Wt 135.4 lb

## 2024-09-26 DIAGNOSIS — Z3A32 32 weeks gestation of pregnancy: Secondary | ICD-10-CM

## 2024-09-26 DIAGNOSIS — O0990 Supervision of high risk pregnancy, unspecified, unspecified trimester: Secondary | ICD-10-CM

## 2024-09-26 DIAGNOSIS — O360921 Maternal care for other rhesus isoimmunization, second trimester, fetus 1: Secondary | ICD-10-CM

## 2024-09-26 DIAGNOSIS — O360990 Maternal care for other rhesus isoimmunization, unspecified trimester, not applicable or unspecified: Secondary | ICD-10-CM

## 2024-09-26 DIAGNOSIS — O26893 Other specified pregnancy related conditions, third trimester: Secondary | ICD-10-CM

## 2024-09-26 DIAGNOSIS — Z6791 Unspecified blood type, Rh negative: Secondary | ICD-10-CM | POA: Diagnosis not present

## 2024-09-26 DIAGNOSIS — O0993 Supervision of high risk pregnancy, unspecified, third trimester: Secondary | ICD-10-CM | POA: Diagnosis not present

## 2024-09-26 DIAGNOSIS — Z23 Encounter for immunization: Secondary | ICD-10-CM | POA: Diagnosis not present

## 2024-09-26 DIAGNOSIS — O26899 Other specified pregnancy related conditions, unspecified trimester: Secondary | ICD-10-CM

## 2024-09-26 MED ORDER — RHO D IMMUNE GLOBULIN 1500 UNIT/2ML IJ SOSY
300.0000 ug | PREFILLED_SYRINGE | Freq: Once | INTRAMUSCULAR | Status: AC
  Administered 2024-09-26: 300 ug via INTRAMUSCULAR

## 2024-09-26 NOTE — Progress Notes (Unsigned)
 "   HIGH-RISK PREGNANCY VISIT Patient name: Deanna Welch MRN [id number]  Date of birth: 05/15/2001 Chief Complaint:   Routine Prenatal Visit and Pregnancy Ultrasound  History of Present Illness:   Deanna Welch is a 24 y.o. G1P0 female at [redacted]w[redacted]d with an Estimated Date of Delivery: 11/18/24 being seen today for ongoing management of a high-risk pregnancy complicated by Anti E antigen.    Today she reports no complaints. Contractions: Not present.  .  Movement: Present. {Actions; denies-reports:120008} leaking of fluid.      09/05/2024   10:06 AM 05/06/2024   11:47 AM 09/09/2019    1:41 PM  Depression screen PHQ 2/9  Decreased Interest 0 3 2  Down, Depressed, Hopeless 0 1 2  PHQ - 2 Score 0 4 4  Altered sleeping 1 3 2   Tired, decreased energy 1 3 0  Change in appetite 0 3 0  Feeling bad or failure about yourself  0 0 2  Trouble concentrating 0 0 2  Moving slowly or fidgety/restless 1 0 0  Suicidal thoughts 0 0   PHQ-9 Score 3 13  10       Data saved with a previous flowsheet row definition        09/05/2024   10:06 AM 05/06/2024   11:47 AM  GAD 7 : Generalized Anxiety Score  Nervous, Anxious, on Edge 0  1   Control/stop worrying 0  1   Worry too much - different things 0  1   Trouble relaxing 1  2   Restless 1  1   Easily annoyed or irritable 1  3   Afraid - awful might happen 1  1   Total GAD 7 Score 4 10     Data saved with a previous flowsheet row definition     Review of Systems:   Pertinent items are noted in HPI Denies abnormal vaginal discharge w/ itching/odor/irritation, headaches, visual changes, shortness of breath, chest pain, abdominal pain, severe nausea/vomiting, or problems with urination or bowel movements unless otherwise stated above. Pertinent History Reviewed:  Reviewed past medical,surgical, social, obstetrical and family history.  Reviewed problem list, medications and allergies. Physical Assessment:   Vitals:   09/26/24 1449  BP: 107/73   Pulse: 94  Weight: 135 lb 6.4 oz (61.4 kg)  Body mass index is 22.53 kg/m.           Physical Examination:   General appearance: alert, well appearing, and in no distress  Mental status: alert, oriented to person, place, and time  Skin: warm & dry   Extremities: Edema: None    Cardiovascular: normal heart rate noted  Respiratory: normal respiratory effort, no distress  Abdomen: gravid, soft, non-tender  Pelvic: Cervical exam deferred         Chaperone: N/A    Fetal Status:     Movement: Present    Fetal Surveillance Testing today: US  32+3 wks,cephalic,posterior placenta gr 2,AFI 14 cm,normal ovaries,FHR 148 BPM,EFW 1812 g 20%      No results found for this or any previous visit (from the past 24 hours).  Assessment & Plan:  High-risk pregnancy: G1P0 at [redacted]w[redacted]d with an Estimated Date of Delivery: 11/18/24   1. Supervision of high risk pregnancy, antepartum (Primary)   2. [redacted] weeks gestation of pregnancy   3. Rh negative state in antepartum period Rhogam today  4. Anti-E isoimmunization affecting pregnancy, antepartum, single or unspecified fetus Followed by MFM:  MCA doppler study normal, pt opted to continue to  watch (vs Partner screening for E antigen or UNITY screening of the fetus for E antigen).  Growth normal today and no evidence of hydrops,  Repeat antibody screeningaaaaa   Meds: No orders of the defined types were placed in this encounter.   Orders: No orders of the defined types were placed in this encounter.    Labs/procedures today: flu shot, Tdap, Rhogam, and U/S   Reviewed: Preterm labor symptoms and general obstetric precautions including but not limited to vaginal bleeding, contractions, leaking of fluid and fetal movement were reviewed in detail with the patient.  All questions were answered. Does have home bp cuff. Office bp cuff given: not applicable. Check bp weekly, let us  know if consistently >140 and/or >90.  Follow-up: Return in about 2 weeks  (around 10/10/2024) for HROB w/CNM or MD.   Future Appointments  Date Time Provider Department Center  10/24/2024  1:15 PM [facility] PROVIDER 1 [facility]  10/24/2024  1:30 PM [facility] US2 [facility]    No orders of the defined types were placed in this encounter.  Cathlean Ely , DNP, CNM [facility] 09/26/2024 3:29 PM   "

## 2024-09-26 NOTE — Progress Notes (Signed)
 US  32+3 wks,cephalic,posterior placenta gr 2,AFI 14 cm,normal ovaries,FHR 148 BPM,EFW 1812 g 20%

## 2024-10-09 ENCOUNTER — Encounter: Admitting: Women's Health

## 2024-10-24 ENCOUNTER — Other Ambulatory Visit
